# Patient Record
Sex: Male | Born: 1960 | Hispanic: No | Marital: Married | Smoking: Never smoker
Health system: Southern US, Community
[De-identification: ages and names within clinical notes are randomized; demographics above are authoritative.]

## PROBLEM LIST (undated history)

## (undated) DIAGNOSIS — T7840XA Allergy, unspecified, initial encounter: Secondary | ICD-10-CM

## (undated) DIAGNOSIS — K219 Gastro-esophageal reflux disease without esophagitis: Secondary | ICD-10-CM

## (undated) HISTORY — PX: VASECTOMY: SHX75

## (undated) HISTORY — PX: CHOLECYSTECTOMY: SHX55

## (undated) HISTORY — PX: WISDOM TOOTH EXTRACTION: SHX21

## (undated) HISTORY — DX: Allergy, unspecified, initial encounter: T78.40XA

## (undated) HISTORY — DX: Gastro-esophageal reflux disease without esophagitis: K21.9

---

## 1998-05-29 ENCOUNTER — Emergency Department (HOSPITAL_COMMUNITY): Admission: EM | Admit: 1998-05-29 | Discharge: 1998-05-29 | Payer: Self-pay | Admitting: Emergency Medicine

## 1998-06-10 ENCOUNTER — Ambulatory Visit (HOSPITAL_COMMUNITY): Admission: RE | Admit: 1998-06-10 | Discharge: 1998-06-10 | Payer: Self-pay | Admitting: Emergency Medicine

## 1998-07-26 ENCOUNTER — Ambulatory Visit (HOSPITAL_COMMUNITY): Admission: RE | Admit: 1998-07-26 | Discharge: 1998-07-26 | Payer: Self-pay | Admitting: General Surgery

## 1999-02-11 ENCOUNTER — Emergency Department (HOSPITAL_COMMUNITY): Admission: EM | Admit: 1999-02-11 | Discharge: 1999-02-11 | Payer: Self-pay | Admitting: Emergency Medicine

## 1999-02-16 ENCOUNTER — Emergency Department (HOSPITAL_COMMUNITY): Admission: EM | Admit: 1999-02-16 | Discharge: 1999-02-16 | Payer: Self-pay | Admitting: Emergency Medicine

## 2012-10-16 IMAGING — CR DG HIP COMPLETE 2+V*R*
2 series · 2 of 2 positions shown · non-contrast
Comparison: None.

CLINICAL DATA: Right hip pain

EXAM:
RIGHT HIP - COMPLETE 2+ VIEW

[AP]
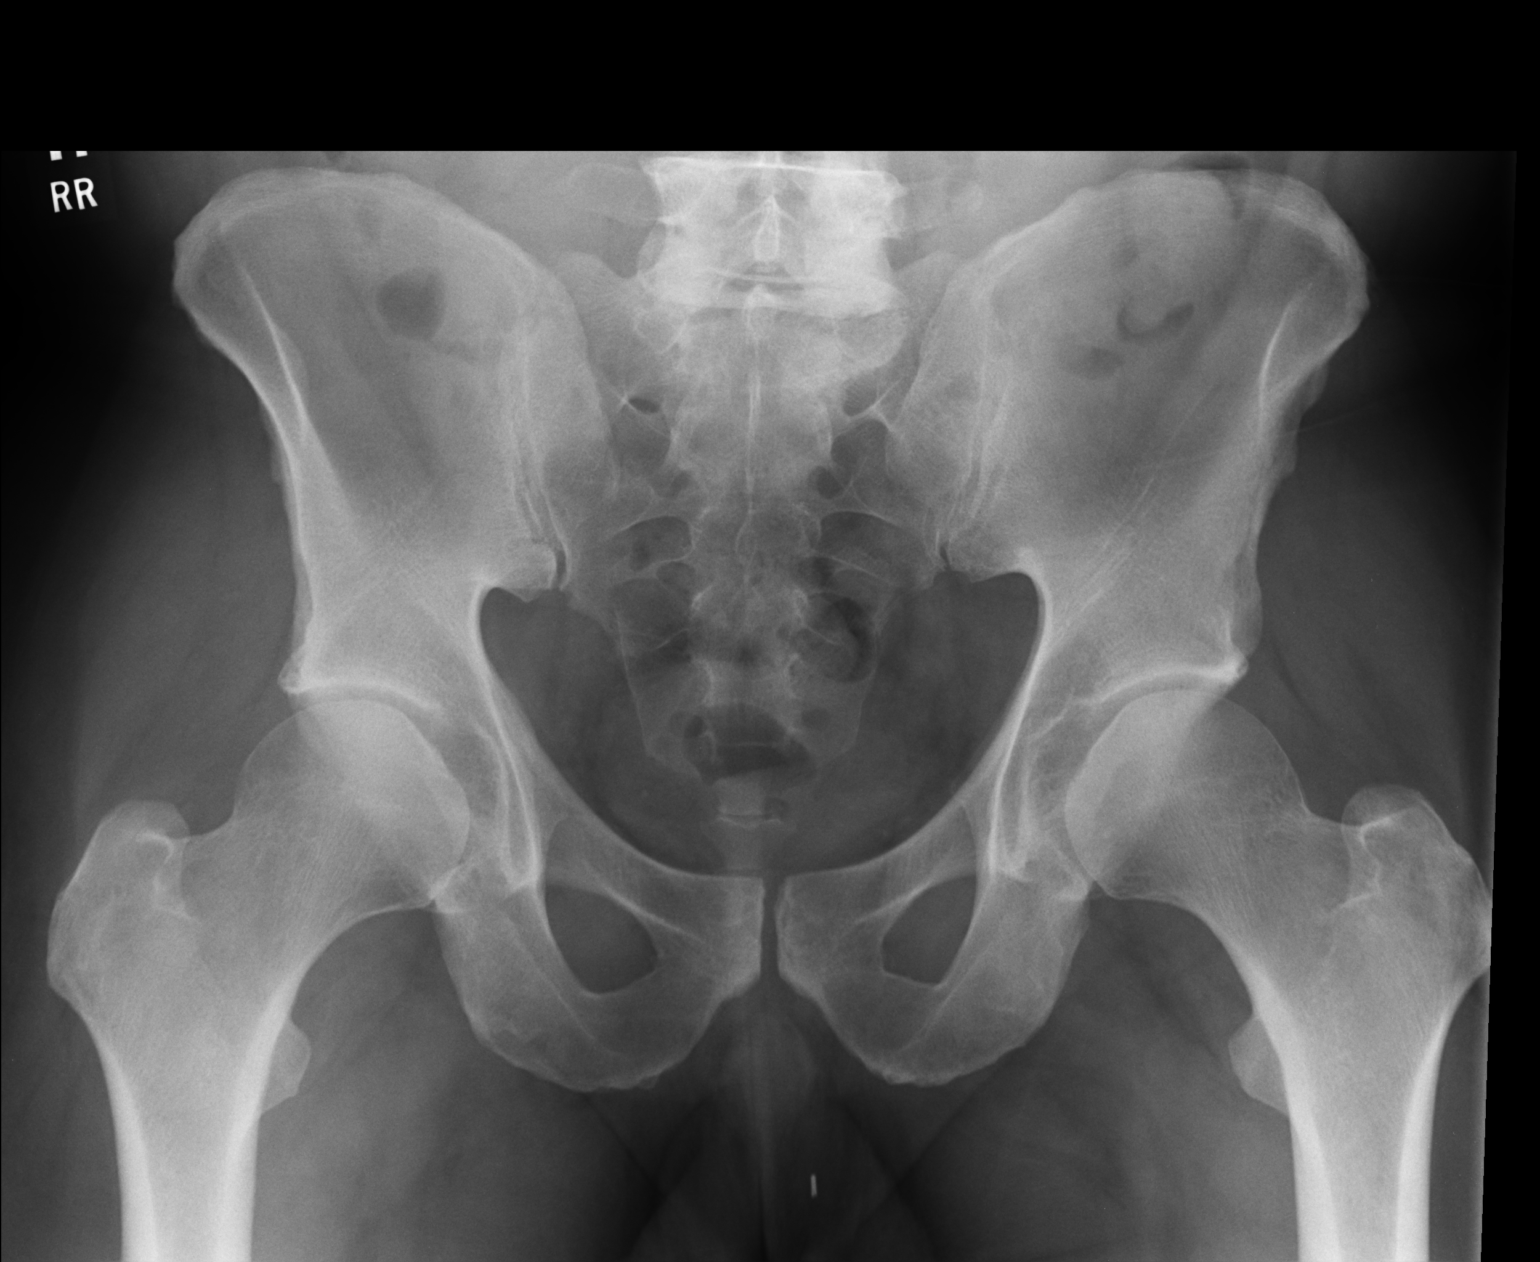

[lateral]
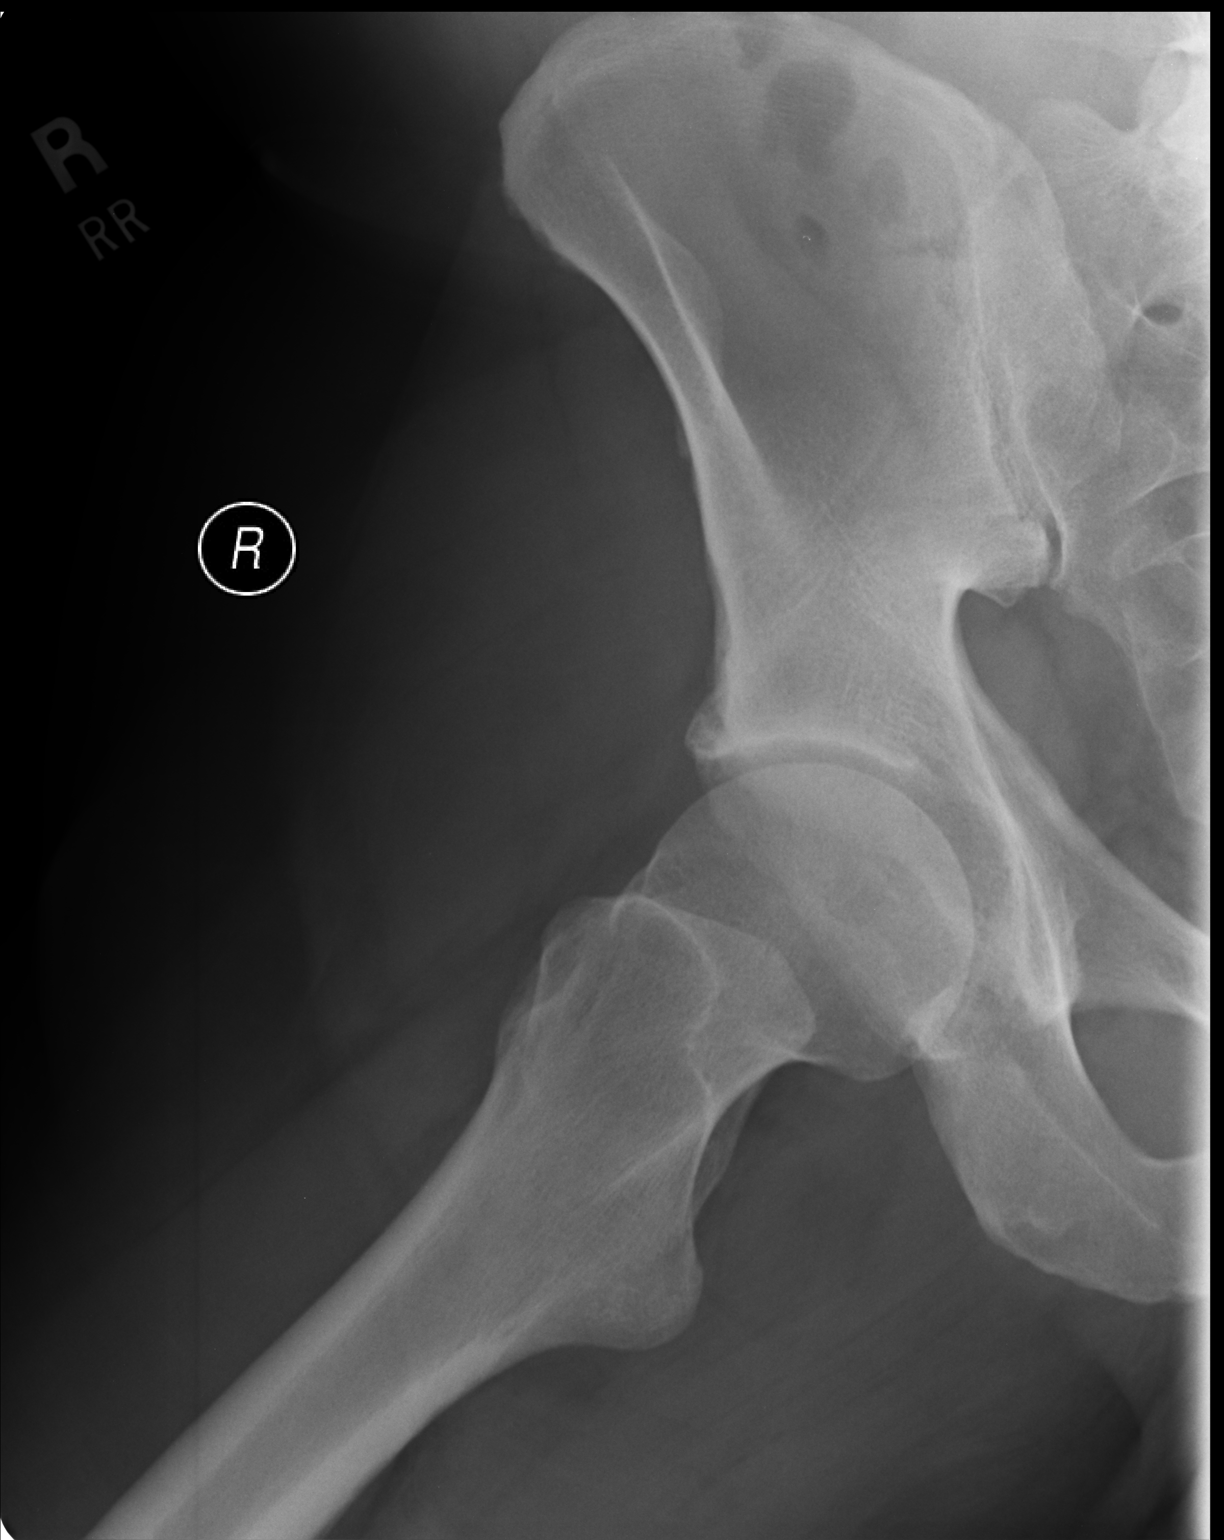

[2 of 2 positions shown; findings below may reference images not displayed]

FINDINGS: There is no evidence of hip fracture or dislocation. There is no
evidence of arthropathy or other focal bone abnormality.
IMPRESSION: Negative.

## 2013-10-29 HISTORY — PX: COLONOSCOPY: SHX174

## 2014-02-18 ENCOUNTER — Ambulatory Visit: Payer: BC Managed Care – PPO | Admitting: Emergency Medicine

## 2014-02-18 ENCOUNTER — Ambulatory Visit: Payer: BC Managed Care – PPO

## 2014-02-18 VITALS — BP 126/76 | HR 78 | Temp 97.9°F | Resp 16 | Ht 65.0 in | Wt 205.2 lb

## 2014-02-18 DIAGNOSIS — M25551 Pain in right hip: Secondary | ICD-10-CM

## 2014-02-18 DIAGNOSIS — M25559 Pain in unspecified hip: Secondary | ICD-10-CM

## 2014-02-18 DIAGNOSIS — M549 Dorsalgia, unspecified: Secondary | ICD-10-CM

## 2014-02-18 IMAGING — CR DG LUMBAR SPINE 2-3V
2 series · 2 of 2 positions shown · non-contrast
Comparison: None.

CLINICAL DATA: Right hip pain

EXAM:
LUMBAR SPINE - 2-3 VIEW

[AP]
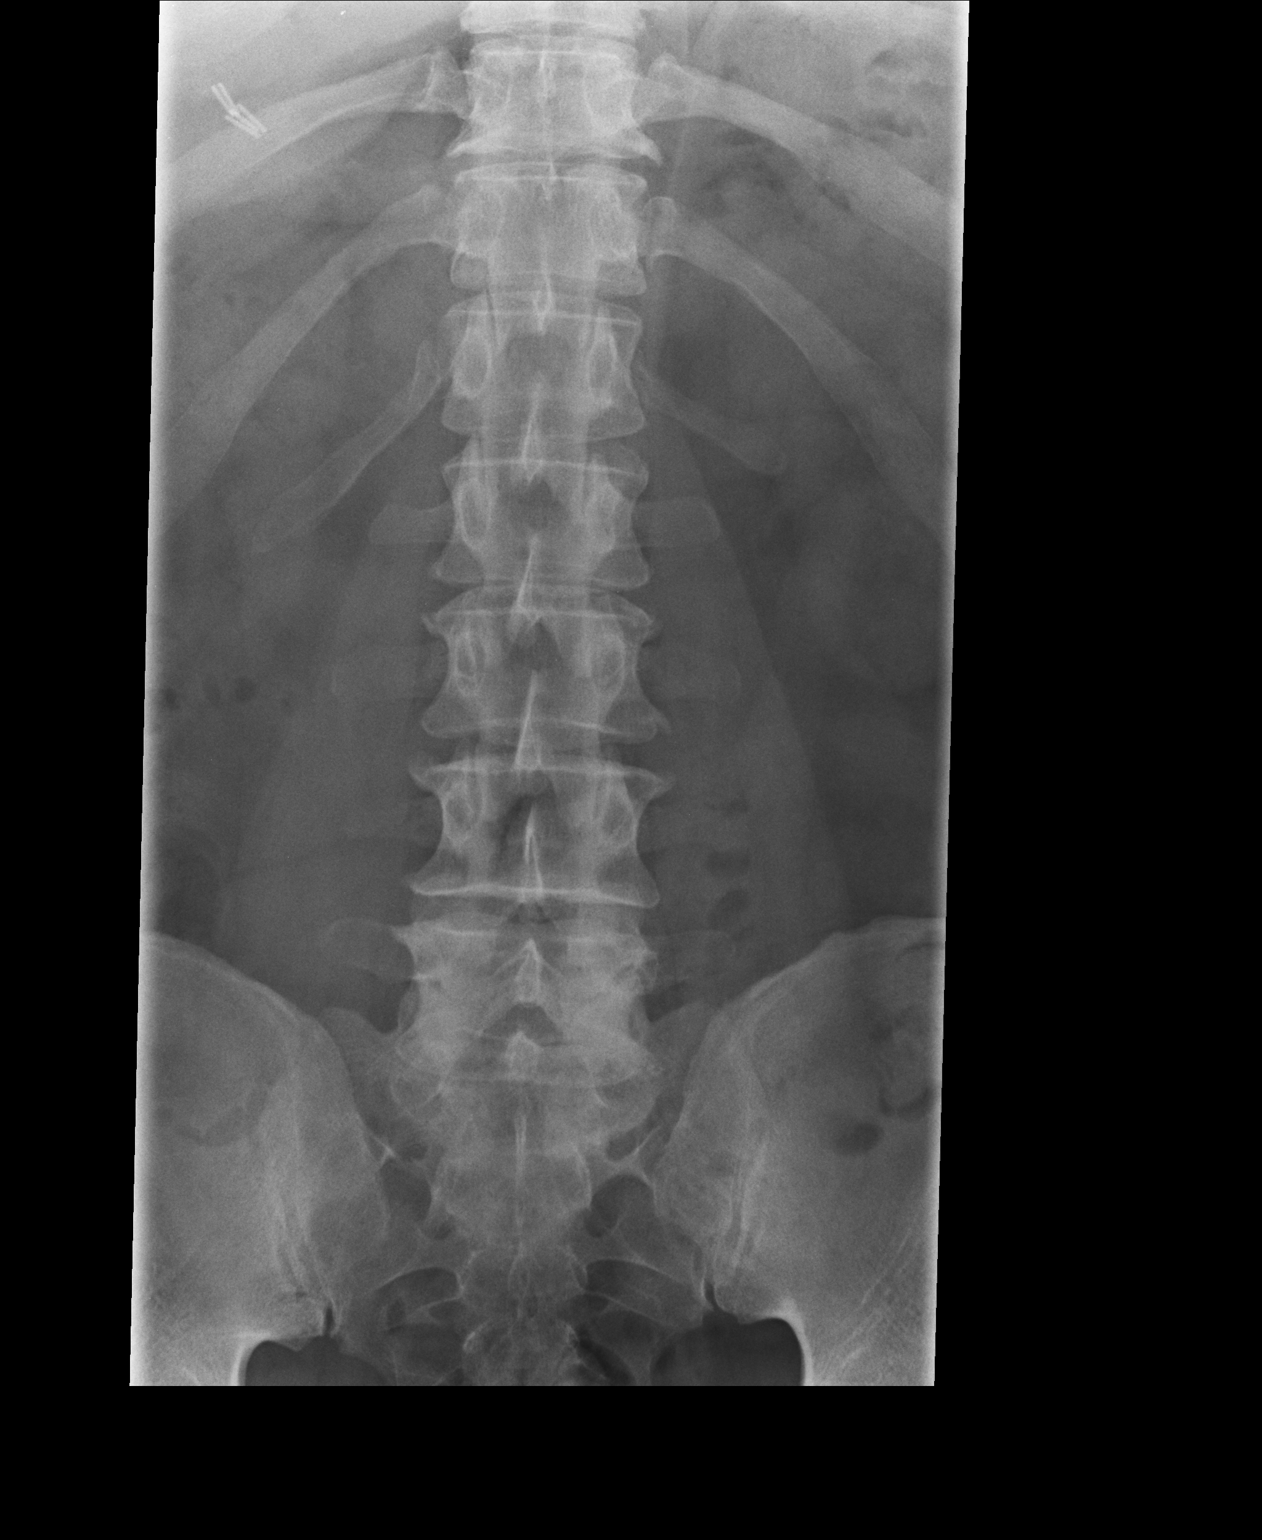

[lateral]
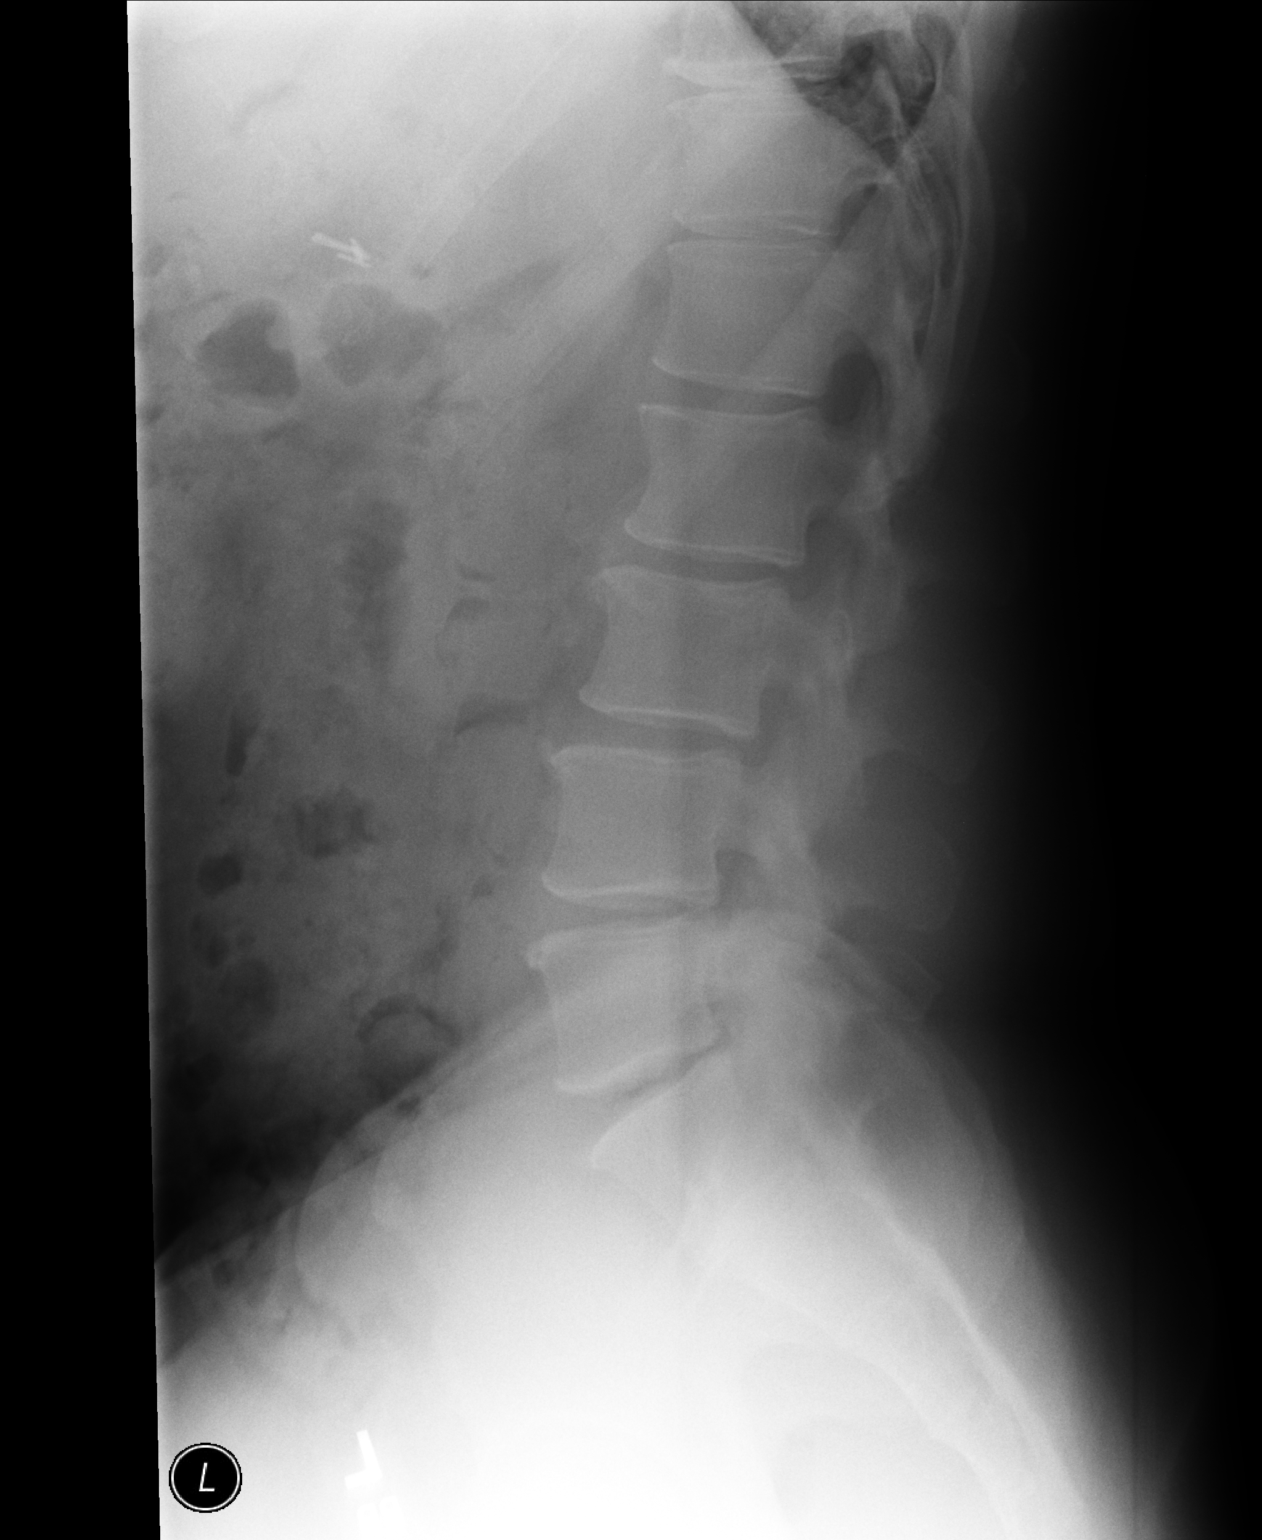

[2 of 2 positions shown; findings below may reference images not displayed]

FINDINGS: There are 5 nonrib bearing lumbar-type vertebral bodies. The
vertebral body heights are maintained. There is no spondylolysis.
There is no acute fracture. The disc spaces are maintained. Mild
degenerative disc disease and degenerative facet arthropathy at L4-5
and L5-S1. Mild retrolisthesis of L4 on L5 measuring 3 mm.

The SI joints are unremarkable.
IMPRESSION: Mild degenerative disc disease and degenerative facet arthropathy at
L4-5 and L5-S1.

## 2014-02-18 MED ORDER — TRAMADOL HCL 50 MG PO TABS: 50.0000 mg | ORAL_TABLET | Freq: Three times a day (TID) | ORAL | Status: DC | PRN

## 2014-02-18 MED ORDER — PREDNISONE 20 MG PO TABS: ORAL_TABLET | ORAL | Status: DC

## 2014-02-18 NOTE — Patient Instructions (Signed)
Sciatica °Sciatica is pain, weakness, numbness, or tingling along the path of the sciatic nerve. The nerve starts in the lower back and runs down the back of each leg. The nerve controls the muscles in the lower leg and in the back of the knee, while also providing sensation to the back of the thigh, lower leg, and the sole of your foot. Sciatica is a symptom of another medical condition. For instance, nerve damage or certain conditions, such as a herniated disk or bone spur on the spine, pinch or put pressure on the sciatic nerve. This causes the pain, weakness, or other sensations normally associated with sciatica. Generally, sciatica only affects one side of the body. °CAUSES  °· Herniated or slipped disc. °· Degenerative disk disease. °· A pain disorder involving the narrow muscle in the buttocks (piriformis syndrome). °· Pelvic injury or fracture. °· Pregnancy. °· Tumor (rare). °SYMPTOMS  °Symptoms can vary from mild to very severe. The symptoms usually travel from the low back to the buttocks and down the back of the leg. Symptoms can include: °· Mild tingling or dull aches in the lower back, leg, or hip. °· Numbness in the back of the calf or sole of the foot. °· Burning sensations in the lower back, leg, or hip. °· Sharp pains in the lower back, leg, or hip. °· Leg weakness. °· Severe back pain inhibiting movement. °These symptoms may get worse with coughing, sneezing, laughing, or prolonged sitting or standing. Also, being overweight may worsen symptoms. °DIAGNOSIS  °Your caregiver will perform a physical exam to look for common symptoms of sciatica. He or she may ask you to do certain movements or activities that would trigger sciatic nerve pain. Other tests may be performed to find the cause of the sciatica. These may include: °· Blood tests. °· X-rays. °· Imaging tests, such as an MRI or CT scan. °TREATMENT  °Treatment is directed at the cause of the sciatic pain. Sometimes, treatment is not necessary  and the pain and discomfort goes away on its own. If treatment is needed, your caregiver may suggest: °· Over-the-counter medicines to relieve pain. °· Prescription medicines, such as anti-inflammatory medicine, muscle relaxants, or narcotics. °· Applying heat or ice to the painful area. °· Steroid injections to lessen pain, irritation, and inflammation around the nerve. °· Reducing activity during periods of pain. °· Exercising and stretching to strengthen your abdomen and improve flexibility of your spine. Your caregiver may suggest losing weight if the extra weight makes the back pain worse. °· Physical therapy. °· Surgery to eliminate what is pressing or pinching the nerve, such as a bone spur or part of a herniated disk. °HOME CARE INSTRUCTIONS  °· Only take over-the-counter or prescription medicines for pain or discomfort as directed by your caregiver. °· Apply ice to the affected area for 20 minutes, 3 4 times a day for the first 48 72 hours. Then try heat in the same way. °· Exercise, stretch, or perform your usual activities if these do not aggravate your pain. °· Attend physical therapy sessions as directed by your caregiver. °· Keep all follow-up appointments as directed by your caregiver. °· Do not wear high heels or shoes that do not provide proper support. °· Check your mattress to see if it is too soft. A firm mattress may lessen your pain and discomfort. °SEEK IMMEDIATE MEDICAL CARE IF:  °· You lose control of your bowel or bladder (incontinence). °· You have increasing weakness in the lower back,   pelvis, buttocks, or legs. °· You have redness or swelling of your back. °· You have a burning sensation when you urinate. °· You have pain that gets worse when you lie down or awakens you at night. °· Your pain is worse than you have experienced in the past. °· Your pain is lasting longer than 4 weeks. °· You are suddenly losing weight without reason. °MAKE SURE YOU: °· Understand these  instructions. °· Will watch your condition. °· Will get help right away if you are not doing well or get worse. °Document Released: 10/09/2001 Document Revised: 04/15/2012 Document Reviewed: 02/24/2012 °[employer]® Patient Information ©2014 [employer]. ° °

## 2014-02-18 NOTE — Progress Notes (Signed)
   Subjective:    Patient ID: Bryan Hunt, male    DOB: Feb 09, 1961, 53 y.o.   MRN: [id number] This chart was scribed for Bryan Queen, MD by Rolanda Lundborg, ED Scribe. This patient was seen in room 1 and the patient's care was started at 8:04 AM.  Chief Complaint  Patient presents with  . Hip Pain    noticed monday , believes it to be from yoga he did on sunday     HPI HPI Comments: Bryan Hunt is a 53 y.o. male who presents to [facility] complaining of shooting right hip pain onset 3 days ago after doing yoga 4 days ago. He states the pain worsens when he goes from sitting to standing or vice versa. He does not remember any specific injuries during the yoga workout. He states he has not done yoga in over 6 months and was hoping to get loosened up for a golf trip that starts tomorrow.   PCP - Wendie Agreste, MD  History reviewed. No pertinent past medical history. No current outpatient prescriptions on file prior to visit.   No current facility-administered medications on file prior to visit.   Allergies  Allergen Reactions  . Dust Mite Extract      Review of Systems  Musculoskeletal: Positive for arthralgias (right hip).       Objective:   Physical Exam  CONSTITUTIONAL: Well developed/well nourished HEAD: Normocephalic/atraumatic EYES: EOMI/PERRL ENMT: Mucous membranes moist NECK: supple no meningeal signs SPINE: Tender L5 S1 on the right. Straight leg raise positive at 85 degrees. Pain when he tries to lay down. Reflexes are 2+ and symmetrical. Motor strength is symmetrical.  CV: S1/S2 noted, no murmurs/rubs/gallops noted LUNGS: Lungs are clear to auscultation bilaterally, no apparent distress ABDOMEN: soft, nontender, no rebound or guarding GU:no cva tenderness NEURO: Pt is awake/alert, moves all extremitiesx4 EXTREMITIES: pulses normal, full ROM.  SKIN: warm, color normal PSYCH: no abnormalities of mood noted   Filed Vitals:   02/18/14  0758  BP: 126/76  Pulse: 78  Temp: 97.9 F (36.6 C)  Resp: 16   [facility] reading (PRIMARY) by  Dr.Viviann Broyles hip film is normal. There is significant L5-S1 degenerative disease.      Assessment & Plan:  **Disclaimer: This note was dictated with voice recognition software. Similar sounding words can inadvertently be transcribed and this note may contain transcription errors which may not have been corrected upon publication of note.** Will treat with a prednisone taper Ultram for pain. I asked him to hold off on his yoga for now.

## 2014-03-20 ENCOUNTER — Ambulatory Visit: Payer: BC Managed Care – PPO | Admitting: Internal Medicine

## 2014-03-20 VITALS — BP 110/74 | HR 64 | Temp 97.8°F | Ht 65.0 in | Wt 205.0 lb

## 2014-03-20 DIAGNOSIS — IMO0002 Reserved for concepts with insufficient information to code with codable children: Secondary | ICD-10-CM

## 2014-03-20 MED ORDER — CEPHALEXIN 500 MG PO CAPS: 500.0000 mg | ORAL_CAPSULE | Freq: Three times a day (TID) | ORAL | Status: DC

## 2014-03-20 NOTE — Progress Notes (Signed)
   Subjective:    Patient ID: Bryan Hunt, male    DOB: 07/21/1961, 53 y.o.   MRN: [id number]  HPI This chart was scribed for Tami Lin, MD by Ladene Artist, ED Scribe. The patient was seen in room 11. Patient's care was started at 11:33 AM.  HPI Comments: Bryan Hunt is a 53 y.o. male who presents to the [facility] complaining of R great toe pain onset less than a week ago. Pt states that he had a pedicure approximately 3 weeks ago and suspects that they cut his toenail too low. No h/o similar symptoms. No known allergies.  No past medical history on file. Current Outpatient Prescriptions on File Prior to Visit  Medication Sig Dispense Refill  . predniSONE (DELTASONE) 20 MG tablet Take 3 a day for 3 days 2 a day for 3 days one a day for 3 days.  18 tablet  0  . traMADol (ULTRAM) 50 MG tablet Take 1 tablet (50 mg total) by mouth every 8 (eight) hours as needed.  30 tablet  0   No current facility-administered medications on file prior to visit.   Allergies  Allergen Reactions  . Dust Mite Extract     Review of Systems  Constitutional: Negative for fever and chills.  HENT: Negative for sore throat.   Skin: Positive for wound.      Objective:   Physical Exam  Nursing note and vitals reviewed. Constitutional: He is oriented to person, place, and time. He appears well-developed and well-nourished. No distress.  HENT:  Head: Normocephalic and atraumatic.  Eyes: EOM are normal.  Neck: Neck supple.  Pulmonary/Chest: Effort normal. No respiratory distress.  Musculoskeletal: Normal range of motion.  Neurological: He is alert and oriented to person, place, and time.  Skin: Skin is warm and dry.  R foot:  Redness and tenderness along the medial aspect of first nail Pus has drained from the proximal corner of the nail and there is an opening there   Psychiatric: He has a normal mood and affect. His behavior is normal.      Assessment & Plan:   I  personally performed the services described in this documentation, which was scribed in my presence. The recorded information has been reviewed and is accurate.  Paronychia  Meds ordered this encounter  Medications  . cephALEXin (KEFLEX) 500 MG capsule    Sig: Take 1 capsule (500 mg total) by mouth 3 (three) times daily.    Dispense:  30 capsule    Refill:  0   Hot soaks 3 times a day

## 2014-04-12 ENCOUNTER — Ambulatory Visit (INDEPENDENT_AMBULATORY_CARE_PROVIDER_SITE_OTHER): Payer: BC Managed Care – PPO | Admitting: Family Medicine

## 2014-04-12 ENCOUNTER — Encounter: Payer: Self-pay | Admitting: Family Medicine

## 2014-04-12 VITALS — BP 124/80 | HR 67 | Temp 98.0°F | Resp 16 | Ht 65.0 in | Wt 203.2 lb

## 2014-04-12 DIAGNOSIS — Z125 Encounter for screening for malignant neoplasm of prostate: Secondary | ICD-10-CM

## 2014-04-12 DIAGNOSIS — K219 Gastro-esophageal reflux disease without esophagitis: Secondary | ICD-10-CM

## 2014-04-12 DIAGNOSIS — Z1211 Encounter for screening for malignant neoplasm of colon: Secondary | ICD-10-CM

## 2014-04-12 DIAGNOSIS — Z0000 Encounter for general adult medical examination without abnormal findings: Secondary | ICD-10-CM

## 2014-04-12 LAB — POCT URINALYSIS DIPSTICK
Bilirubin, UA: NEGATIVE
Blood, UA: NEGATIVE
Glucose, UA: NEGATIVE
KETONES UA: NEGATIVE
LEUKOCYTES UA: NEGATIVE
Nitrite, UA: NEGATIVE
Protein, UA: NEGATIVE
Spec Grav, UA: 1.025
Urobilinogen, UA: 0.2
pH, UA: 6

## 2014-04-12 LAB — LIPID PANEL
CHOLESTEROL: 198 mg/dL (ref 0–200)
HDL: 43 mg/dL (ref >39)
LDL Cholesterol: 128 mg/dL — ABNORMAL HIGH (ref 0–99)
Total CHOL/HDL Ratio: 4.6 Ratio
Triglycerides: 134 mg/dL (ref <150)
VLDL: 27 mg/dL (ref 0–40)

## 2014-04-12 LAB — CBC
HCT: 43 % (ref 39.0–52.0)
HEMOGLOBIN: 15.4 g/dL (ref 13.0–17.0)
MCH: 31.3 pg (ref 26.0–34.0)
MCHC: 35.8 g/dL (ref 30.0–36.0)
MCV: 87.4 fL (ref 78.0–100.0)
PLATELETS: 196 10*3/uL (ref 150–400)
RBC: 4.92 MIL/uL (ref 4.22–5.81)
RDW: 13.6 % (ref 11.5–15.5)
WBC: 6.9 10*3/uL (ref 4.0–10.5)

## 2014-04-12 LAB — COMPLETE METABOLIC PANEL WITH GFR
ALBUMIN: 4.4 g/dL (ref 3.5–5.2)
ALK PHOS: 65 U/L (ref 39–117)
ALT: 25 U/L (ref 0–53)
AST: 21 U/L (ref 0–37)
BUN: 21 mg/dL (ref 6–23)
CO2: 26 mEq/L (ref 19–32)
Calcium: 9.4 mg/dL (ref 8.4–10.5)
Chloride: 105 mEq/L (ref 96–112)
Creat: 1.01 mg/dL (ref 0.50–1.35)
GFR, Est African American: >89 mL/min
GFR, Est Non African American: 85 mL/min
GLUCOSE: 89 mg/dL (ref 70–99)
POTASSIUM: 4.4 meq/L (ref 3.5–5.3)
SODIUM: 139 meq/L (ref 135–145)
TOTAL PROTEIN: 7 g/dL (ref 6.0–8.3)
Total Bilirubin: 0.6 mg/dL (ref 0.2–1.2)

## 2014-04-12 NOTE — Progress Notes (Signed)
   Subjective:    Patient ID: Bryan Hunt, male    DOB: 1960/11/19, 53 y.o.   MRN: [id number]  HPI    Review of Systems  Constitutional: Negative.   HENT: Negative.   Eyes: Negative.   Respiratory: Negative.   Cardiovascular: Negative.   Gastrointestinal: Negative.   Endocrine: Negative.   Genitourinary: Negative.   Musculoskeletal: Negative.   Skin: Negative.   Allergic/Immunologic: Positive for environmental allergies.  Neurological: Negative.   Hematological: Negative.   Psychiatric/Behavioral: Negative.        Objective:   Physical Exam        Assessment & Plan:

## 2014-04-12 NOTE — Progress Notes (Signed)
Subjective:    Patient ID: Bryan Hunt, male    DOB: 12/09/1960, 53 y.o.   MRN: [id number]  HPI Bryan Hunt is a 53 y.o. male Last CPE few years ago.   Colonoscopy: has not had. Agrees to have referral.   Prostate cancer screening/last PSA: not done. No known FH of prostate CA. Discussed screening. Agrees to screening today.   Tetanus/Tdap: 7 years ago - approx 2008.  Dentist: q 53months.  Optho/eye care eval: last eval past year.  Advanced Directives: has living will. Full Code.  Exercise: occasional walking or golfing. About 2hrs. Total. No fh of early CAD/heart disease.   Indigestion few weeks ago after traveling to [place], still some heartburn sx's.  Improved with otc prilosec.  Notes with coffee, and beer. Does eat spicy foods at times. No chest pain, no dyspnea, no N/V.  No unexplained wt loss, no dark/tarry stools.   Seasonal allergies - claritin otc. Controlled with this and flonase at times. No fevers.   There are no active problems to display for this patient.  Past Medical History  Diagnosis Date  . Allergy    Past Surgical History  Procedure Laterality Date  . Cholecystectomy    . Vasectomy     Allergies  Allergen Reactions  . Dust Mite Extract    Prior to Admission medications   Medication Sig Start Date End Date Taking? Authorizing Provider  loratadine (CLARITIN) 10 MG tablet Take 10 mg by mouth daily.   Yes Historical Provider, MD  PRESCRIPTION MEDICATION OTC Omeprazole taking   Yes Historical Provider, MD  cephALEXin (KEFLEX) 500 MG capsule Take 1 capsule (500 mg total) by mouth 3 (three) times daily. 03/20/14   Leandrew Koyanagi, MD   History   Social History  . Marital Status: Married    Spouse Name: N/A    Number of Children: N/A  . Years of Education: N/A   Occupational History  . Not on file.   Social History Main Topics  . Smoking status: Never Smoker   . Smokeless tobacco: Not on file  . Alcohol Use: Yes     Comment: 5-10 drinks    . Drug Use: No  . Sexual Activity: Yes   Other Topics Concern  . Not on file   Social History Narrative   Married. Education: [employer].      Review of Systems  13 point review of systems per patient health survey noted.  Negative other than as indicated on reviewed nursing note or above.       Objective:   Physical Exam  Vitals reviewed. Constitutional: He is oriented to person, place, and time. He appears well-developed and well-nourished.  HENT:  Head: Normocephalic and atraumatic.  Right Ear: External ear normal.  Left Ear: External ear normal.  Mouth/Throat: Oropharynx is clear and moist.  Eyes: Conjunctivae and EOM are normal. Pupils are equal, round, and reactive to light.  Neck: Normal range of motion. Neck supple. No thyromegaly present.  Cardiovascular: Normal rate, regular rhythm, normal heart sounds and intact distal pulses.   Pulmonary/Chest: Effort normal and breath sounds normal. No respiratory distress. He has no wheezes.  Abdominal: Soft. He exhibits no distension. There is no tenderness. Hernia confirmed negative in the right inguinal area and confirmed negative in the left inguinal area.  Genitourinary: Prostate normal.  Musculoskeletal: Normal range of motion. He exhibits no edema and no tenderness.  Lymphadenopathy:    He has no cervical adenopathy.  Neurological: He is alert  and oriented to person, place, and time. He has normal reflexes.  Skin: Skin is warm and dry.  Psychiatric: He has a normal mood and affect. His behavior is normal.   Filed Vitals:   04/12/14 1308  BP: 124/80  Pulse: 67  Temp: 98 F (36.7 C)  TempSrc: Oral  Resp: 16  Height: 5\' 5"  (1.651 m)  Weight: 203 lb 3.2 oz (92.171 kg)  SpO2: 95%   Results for orders placed in visit on 04/12/14  POCT URINALYSIS DIPSTICK      Result Value Ref Range   Color, UA yellow     Clarity, UA clear     Glucose, UA neg     Bilirubin, UA neg     Ketones, UA neg     Spec Grav, UA 1.025      Blood, UA neg     pH, UA 6.0     Protein, UA neg     Urobilinogen, UA 0.2     Nitrite, UA neg     Leukocytes, UA Negative         Assessment & Plan:   Bryan Hunt is a 53 y.o. male Annual physical exam - Plan: CBC, COMPLETE METABOLIC PANEL WITH GFR, Lipid panel, PSA, Ambulatory referral to Gastroenterology, POCT urinalysis dipstick  -anticipatory guidance as below in AVS, screening labs above. Health maintenance items as ove in HPI discussed/recommended as applicable.   Special screening for malignant neoplasms, colon - Plan: Ambulatory referral to Gastroenterology for screening colonoscopy.   Screening for prostate cancer - Plan: PSA - and DRE exam performed after discussion of risks/nbenefits of testing and consent obtained to test.   GERD (gastroesophageal reflux disease) - -trigger avoidance discussed, ok to cont prilosec otc for now. If persistent sx's greater than 4-6 wks, consider H pylori testing.    Meds ordered this encounter  Medications  . loratadine (CLARITIN) 10 MG tablet    Sig: Take 10 mg by mouth daily.  [name] PRESCRIPTION MEDICATION    Sig: OTC Omeprazole taking   Patient Instructions  You should receive a call or letter about your lab results within the next week to 10 days.  We will refer you for the colonoscopy.   Ok to continue prilosec and avoid inciting foods as below. If not improved in 4 weeks - return to check other causes.   Return to the clinic or go to the nearest emergency room if any of your symptoms worsen or new symptoms occur.  Keeping you healthy  Get these tests  Blood pressure- Have your blood pressure checked once a year by your healthcare provider.  Normal blood pressure is 120/80  Weight- Have your body mass index (BMI) calculated to screen for obesity.  BMI is a measure of body fat based on height and weight. You can also calculate your own BMI at [employer].  Cholesterol- Have your cholesterol checked every  year.  Diabetes- Have your blood sugar checked regularly if you have high blood pressure, high cholesterol, have a family history of diabetes or if you are overweight.  Screening for Colon Cancer- Colonoscopy starting at age 19.  Screening may begin sooner depending on your family history and other health conditions. Follow up colonoscopy as directed by your Gastroenterologist.  Screening for Prostate Cancer- Both blood work (PSA) and a rectal exam help screen for Prostate Cancer.  Screening begins at age 30 with African-American men and at age 89 with Caucasian men.  Screening may begin sooner depending on  your family history.  Take these medicines  Aspirin- One aspirin daily can help prevent Heart disease and Stroke.  Flu shot- Every fall.  Tetanus- Every 10 years.  Zostavax- Once after the age of 60 to prevent Shingles.  Pneumonia shot- Once after the age of 17; if you are younger than 16, ask your healthcare provider if you need a Pneumonia shot.  Take these steps  Don't smoke- If you do smoke, talk to your doctor about quitting.  For tips on how to quit, go to www.smokefree.gov or call 1-800-QUIT-NOW.  Be physically active- Exercise 5 days a week for at least 30 minutes.  If you are not already physically active start slow and gradually work up to 30 minutes of moderate physical activity.  Examples of moderate activity include walking briskly, mowing the yard, dancing, swimming, bicycling, etc.  Eat a healthy diet- Eat a variety of healthy food such as fruits, vegetables, low fat milk, low fat cheese, yogurt, lean meant, poultry, fish, beans, tofu, etc. For more information go to www.thenutritionsource.org  Drink alcohol in moderation- Limit alcohol intake to less than two drinks a day. Never drink and drive.  Dentist- Brush and floss twice daily; visit your dentist twice a year.  Depression- Your emotional health is as important as your physical health. If you're feeling down,  or losing interest in things you would normally enjoy please talk to your healthcare provider.  Eye exam- Visit your eye doctor every year.  Safe sex- If you may be exposed to a sexually transmitted infection, use a condom.  Seat belts- Seat belts can save your life; always wear one.  Smoke/Carbon Monoxide detectors- These detectors need to be installed on the appropriate level of your home.  Replace batteries at least once a year.  Skin cancer- When out in the sun, cover up and use sunscreen 15 SPF or higher.  Violence- If anyone is threatening you, please tell your healthcare provider.  Living Will/ Health care power of attorney- Speak with your healthcare provider and family.   Diet for Gastroesophageal Reflux Disease, Adult Reflux (acid reflux) is when acid from your stomach flows up into the esophagus. When acid comes in contact with the esophagus, the acid causes irritation and soreness (inflammation) in the esophagus. When reflux happens often or so severely that it causes damage to the esophagus, it is called gastroesophageal reflux disease (GERD). Nutrition therapy can help ease the discomfort of GERD. FOODS OR DRINKS TO AVOID OR LIMIT  Smoking or chewing tobacco. Nicotine is one of the most potent stimulants to acid production in the gastrointestinal tract.  Caffeinated and decaffeinated coffee and black tea.  Regular or low-calorie carbonated beverages or energy drinks (caffeine-free carbonated beverages are allowed).   Strong spices, such as black pepper, white pepper, red pepper, cayenne, curry powder, and chili powder.  Peppermint or spearmint.  Chocolate.  High-fat foods, including meats and fried foods. Extra added fats including oils, butter, salad dressings, and nuts. Limit these to less than 8 tsp per day.  Fruits and vegetables if they are not tolerated, such as citrus fruits or tomatoes.  Alcohol.  Any food that seems to aggravate your condition. If you  have questions regarding your diet, call your caregiver or a registered dietitian. OTHER THINGS THAT MAY HELP GERD INCLUDE:   Eating your meals slowly, in a relaxed setting.  Eating 5 to 6 small meals per day instead of 3 large meals.  Eliminating food for a period of time  if it causes distress.  Not lying down until 3 hours after eating a meal.  Keeping the head of your bed raised 6 to 9 inches (15 to 23 cm) by using a foam wedge or blocks under the legs of the bed. Lying flat may make symptoms worse.  Being physically active. Weight loss may be helpful in reducing reflux in overweight or obese adults.  Wear loose fitting clothing EXAMPLE MEAL PLAN This meal plan is approximately 2,000 calories based on [employer] meal planning guidelines. Breakfast   cup cooked oatmeal.  1 cup strawberries.  1 cup low-fat milk.  1 oz almonds. Snack  1 cup cucumber slices.  6 oz yogurt (made from low-fat or fat-free milk). Lunch  2 slice whole-wheat bread.  2 oz sliced [place].  2 tsp mayonnaise.  1 cup blueberries.  1 cup snap peas. Snack  6 whole-wheat crackers.  1 oz string cheese. Dinner   cup brown rice.  1 cup mixed veggies.  1 tsp olive oil.  3 oz grilled fish. Document Released: 10/15/2005 Document Revised: 01/07/2012 Document Reviewed: 08/31/2011 [facility] Patient Information 2014 [place], [place].

## 2014-04-12 NOTE — Patient Instructions (Addendum)
You should receive a call or letter about your lab results within the next week to 10 days.  We will refer you for the colonoscopy.   Ok to continue prilosec and avoid inciting foods as below. If not improved in 4 weeks - return to check other causes.   Return to the clinic or go to the nearest emergency room if any of your symptoms worsen or new symptoms occur.  Keeping you healthy  Get these tests  Blood pressure- Have your blood pressure checked once a year by your healthcare provider.  Normal blood pressure is 120/80  Weight- Have your body mass index (BMI) calculated to screen for obesity.  BMI is a measure of body fat based on height and weight. You can also calculate your own BMI at [employer]ing[employer].  Cholesterol- Have your cholesterol checked every year.  Diabetes- Have your blood sugar checked regularly if you have high blood pressure, high cholesterol, have a family history of diabetes or if you are overweight.  Screening for Colon Cancer- Colonoscopy starting at age 31.  Screening may begin sooner depending on your family history and other health conditions. Follow up colonoscopy as directed by your Gastroenterologist.  Screening for Prostate Cancer- Both blood work (PSA) and a rectal exam help screen for Prostate Cancer.  Screening begins at age 46 with African-American men and at age 34 with Caucasian men.  Screening may begin sooner depending on your family history.  Take these medicines  Aspirin- One aspirin daily can help prevent Heart disease and Stroke.  Flu shot- Every fall.  Tetanus- Every 10 years.  Zostavax- Once after the age of 50 to prevent Shingles.  Pneumonia shot- Once after the age of 22; if you are younger than 21, ask your healthcare provider if you need a Pneumonia shot.  Take these steps  Don't smoke- If you do smoke, talk to your doctor about quitting.  For tips on how to quit, go to www.smokefree.gov or call 1-800-QUIT-NOW.  Be  physically active- Exercise 5 days a week for at least 30 minutes.  If you are not already physically active start slow and gradually work up to 30 minutes of moderate physical activity.  Examples of moderate activity include walking briskly, mowing the yard, dancing, swimming, bicycling, etc.  Eat a healthy diet- Eat a variety of healthy food such as fruits, vegetables, low fat milk, low fat cheese, yogurt, lean meant, poultry, fish, beans, tofu, etc. For more information go to www.thenutritionsource.org  Drink alcohol in moderation- Limit alcohol intake to less than two drinks a day. Never drink and drive.  Dentist- Brush and floss twice daily; visit your dentist twice a year.  Depression- Your emotional health is as important as your physical health. If you're feeling down, or losing interest in things you would normally enjoy please talk to your healthcare provider.  Eye exam- Visit your eye doctor every year.  Safe sex- If you may be exposed to a sexually transmitted infection, use a condom.  Seat belts- Seat belts can save your life; always wear one.  Smoke/Carbon Monoxide detectors- These detectors need to be installed on the appropriate level of your home.  Replace batteries at least once a year.  Skin cancer- When out in the sun, cover up and use sunscreen 15 SPF or higher.  Violence- If anyone is threatening you, please tell your healthcare provider.  Living Will/ Health care power of attorney- Speak with your healthcare provider and family.   Diet for Gastroesophageal  Reflux Disease, Adult Reflux (acid reflux) is when acid from your stomach flows up into the esophagus. When acid comes in contact with the esophagus, the acid causes irritation and soreness (inflammation) in the esophagus. When reflux happens often or so severely that it causes damage to the esophagus, it is called gastroesophageal reflux disease (GERD). Nutrition therapy can help ease the discomfort of GERD. FOODS  OR DRINKS TO AVOID OR LIMIT  Smoking or chewing tobacco. Nicotine is one of the most potent stimulants to acid production in the gastrointestinal tract.  Caffeinated and decaffeinated coffee and black tea.  Regular or low-calorie carbonated beverages or energy drinks (caffeine-free carbonated beverages are allowed).   Strong spices, such as black pepper, white pepper, red pepper, cayenne, curry powder, and chili powder.  Peppermint or spearmint.  Chocolate.  High-fat foods, including meats and fried foods. Extra added fats including oils, butter, salad dressings, and nuts. Limit these to less than 8 tsp per day.  Fruits and vegetables if they are not tolerated, such as citrus fruits or tomatoes.  Alcohol.  Any food that seems to aggravate your condition. If you have questions regarding your diet, call your caregiver or a registered dietitian. OTHER THINGS THAT MAY HELP GERD INCLUDE:   Eating your meals slowly, in a relaxed setting.  Eating 5 to 6 small meals per day instead of 3 large meals.  Eliminating food for a period of time if it causes distress.  Not lying down until 3 hours after eating a meal.  Keeping the head of your bed raised 6 to 9 inches (15 to 23 cm) by using a foam wedge or blocks under the legs of the bed. Lying flat may make symptoms worse.  Being physically active. Weight loss may be helpful in reducing reflux in overweight or obese adults.  Wear loose fitting clothing EXAMPLE MEAL PLAN This meal plan is approximately 2,000 calories based on [employer] meal planning guidelines. Breakfast   cup cooked oatmeal.  1 cup strawberries.  1 cup low-fat milk.  1 oz almonds. Snack  1 cup cucumber slices.  6 oz yogurt (made from low-fat or fat-free milk). Lunch  2 slice whole-wheat bread.  2 oz sliced [place].  2 tsp mayonnaise.  1 cup blueberries.  1 cup snap peas. Snack  6 whole-wheat crackers.  1 oz string cheese. Dinner    cup brown rice.  1 cup mixed veggies.  1 tsp olive oil.  3 oz grilled fish. Document Released: 10/15/2005 Document Revised: 01/07/2012 Document Reviewed: 08/31/2011 [facility] Patient Information 2014 [place], [place].

## 2014-04-13 ENCOUNTER — Encounter: Payer: Self-pay | Admitting: Internal Medicine

## 2014-04-13 LAB — PSA: PSA: 0.99 ng/mL (ref <=4.00)

## 2014-04-14 ENCOUNTER — Other Ambulatory Visit: Payer: Self-pay | Admitting: *Deleted

## 2014-04-19 ENCOUNTER — Ambulatory Visit (AMBULATORY_SURGERY_CENTER): Payer: BC Managed Care – PPO | Admitting: *Deleted

## 2014-04-19 ENCOUNTER — Encounter: Payer: Self-pay | Admitting: Internal Medicine

## 2014-04-19 VITALS — Ht 65.0 in | Wt 207.0 lb

## 2014-04-19 DIAGNOSIS — Z1211 Encounter for screening for malignant neoplasm of colon: Secondary | ICD-10-CM

## 2014-04-19 MED ORDER — MOVIPREP 100 G PO SOLR
ORAL | Status: DC
Start: 2014-04-19 — End: 2014-05-11

## 2014-04-19 NOTE — Progress Notes (Signed)
Patient denies any allergies to eggs or soy. Patient denies any problems with anesthesia/sedation. Patient denies any oxygen use at home and does not take any diet/weight loss medications. EMMI education assisgned to patient on colonoscopy, this was explained and instructions given to patient. 

## 2014-05-05 ENCOUNTER — Encounter: Payer: Self-pay | Admitting: Internal Medicine

## 2014-05-11 ENCOUNTER — Ambulatory Visit (AMBULATORY_SURGERY_CENTER): Payer: BC Managed Care – PPO | Admitting: Internal Medicine

## 2014-05-11 ENCOUNTER — Encounter: Payer: Self-pay | Admitting: Internal Medicine

## 2014-05-11 VITALS — BP 123/93 | HR 64 | Temp 97.7°F | Resp 30 | Ht 65.0 in | Wt 207.0 lb

## 2014-05-11 DIAGNOSIS — D126 Benign neoplasm of colon, unspecified: Secondary | ICD-10-CM

## 2014-05-11 DIAGNOSIS — Z1211 Encounter for screening for malignant neoplasm of colon: Secondary | ICD-10-CM

## 2014-05-11 MED ORDER — SODIUM CHLORIDE 0.9 % IV SOLN: 500.0000 mL | INTRAVENOUS | Status: DC

## 2014-05-11 NOTE — Progress Notes (Signed)
Called to room to assist during endoscopic procedure.  Patient ID and intended procedure confirmed with present staff. Received instructions for my participation in the procedure from the performing physician.  

## 2014-05-11 NOTE — Op Note (Signed)
[facility]  [address] [place], [id number]   COLONOSCOPY PROCEDURE REPORT  PATIENT: Bryan Hunt, Bryan Hunt  MR#: [id number] BIRTHDATE: August 29, 1961 , 65  yrs. old GENDER: Male ENDOSCOPIST: Jerene Bears, MD REFERRED JA:[id number] Greene, M.D. PROCEDURE DATE:  05/11/2014 PROCEDURE:   Colonoscopy with snare polypectomy and Colonoscopy with cold biopsy polypectomy First Screening Colonoscopy - Avg.  risk and is 50 yrs.  old or older Yes.  Prior Negative Screening - Now for repeat screening. N/A  History of Adenoma - Now for follow-up colonoscopy & has been > or = to 3 yrs.  N/A  Polyps Removed Today? Yes. ASA CLASS:   Class II INDICATIONS:average risk screening and first colonoscopy. MEDICATIONS: MAC sedation, administered by CRNA and propofol (Diprivan) 300mg  IV  DESCRIPTION OF PROCEDURE:   After the risks benefits and alternatives of the procedure were thoroughly explained, informed consent was obtained.  A digital rectal exam revealed skin tag with no rectal mass.   The LB [id number]  endoscope was introduced through the anus and advanced to the cecum, which was identified by both the appendix and ileocecal valve. No adverse events experienced.   The quality of the prep was good, using MoviPrep The instrument was then slowly withdrawn as the colon was fully examined.   COLON FINDINGS: Five sessile polyps ranging between 3-28mm in size were found at the cecum (1), in the ascending colon (2), descending colon (1), and sigmoid colon (1).  Polypectomy was performed using cold snare and with cold forceps.  All resections were complete and all polyp tissue was completely retrieved.   Mild diverticulosis was noted in the sigmoid colon.  Retroflexed views revealed prominent anal papilla and small internal hemorrhoids. The time to cecum=3 minutes 07 seconds.  Withdrawal time=16 minutes 40 seconds. The scope was withdrawn and the procedure completed.  COMPLICATIONS: There  were no complications.    ENDOSCOPIC IMPRESSION: 1.   Five sessile polyps ranging between 3-68mm in size were found at the cecum, in the ascending colon, descending colon, and sigmoid colon; Polypectomy was performed using cold snare and with cold forceps 2.   Mild diverticulosis was noted in the sigmoid colon  RECOMMENDATIONS: 1.  Avoid all NSAIDS for the next 2 weeks. 2.  Await pathology results 3.  High fiber diet 4.  If the polyps removed today are proven to be adenomatous (pre-cancerous) polyps, you will need a colonoscopy in 3 years. Otherwise you should continue to follow colorectal cancer screening guidelines for "routine risk" patients with a colonoscopy in 10 years.  You will receive a letter within 1-2 weeks with the results of your biopsy as well as final recommendations.  Please call my office if you have not received a letter after 3 weeks.   eSigned:  Jerene Bears, MD 05/11/2014 2:44 PM   cc: The Patient and Merri Ray, MD   PATIENT NAME:  Bryan Hunt, Bryan Hunt MR#: [id number]

## 2014-05-11 NOTE — Progress Notes (Signed)
A/ox3, pleased with MAC, report to RN 

## 2014-05-11 NOTE — Patient Instructions (Signed)
YOU HAD AN ENDOSCOPIC PROCEDURE TODAY AT THE [facility]: Refer to the procedure report that was given to you for any specific questions about what was found during the examination.  If the procedure report does not answer your questions, please call your gastroenterologist to clarify.  If you requested that your care partner not be given the details of your procedure findings, then the procedure report has been included in a sealed envelope for you to review at your convenience later.  YOU SHOULD EXPECT: Some feelings of bloating in the abdomen. Passage of more gas than usual.  Walking can help get rid of the air that was put into your GI tract during the procedure and reduce the bloating. If you had a lower endoscopy (such as a colonoscopy or flexible sigmoidoscopy) you may notice spotting of blood in your stool or on the toilet paper. If you underwent a bowel prep for your procedure, then you may not have a normal bowel movement for a few days.  DIET: Your first meal following the procedure should be a light meal and then it is ok to progress to your normal diet.  A half-sandwich or bowl of soup is an example of a good first meal.  Heavy or fried foods are harder to digest and may make you feel nauseous or bloated.  Likewise meals heavy in dairy and vegetables can cause extra gas to form and this can also increase the bloating.  Drink plenty of fluids but you should avoid alcoholic beverages for 24 hours.  ACTIVITY: Your care partner should take you home directly after the procedure.  You should plan to take it easy, moving slowly for the rest of the day.  You can resume normal activity the day after the procedure however you should NOT DRIVE or use heavy machinery for 24 hours (because of the sedation medicines used during the test).    SYMPTOMS TO REPORT IMMEDIATELY: A gastroenterologist can be reached at any hour.  During normal business hours, 8:30 AM to 5:00 PM Monday through Friday,  call [phone].  After hours and on weekends, please call the GI answering service at [phone] who will take a message and have the physician on call contact you.   Following lower endoscopy (colonoscopy or flexible sigmoidoscopy):  Excessive amounts of blood in the stool  Significant tenderness or worsening of abdominal pains  Swelling of the abdomen that is new, acute  Fever of 100F or higher  FOLLOW UP: If any biopsies were taken you will be contacted by phone or by letter within the next 1-3 weeks.  Call your gastroenterologist if you have not heard about the biopsies in 3 weeks.  Our staff will call the home number listed on your records the next business day following your procedure to check on you and address any questions or concerns that you may have at that time regarding the information given to you following your procedure. This is a courtesy call and so if there is no answer at the home number and we have not heard from you through the emergency physician on call, we will assume that you have returned to your regular daily activities without incident.  SIGNATURES/CONFIDENTIALITY: You and/or your care partner have signed paperwork which will be entered into your electronic medical record.  These signatures attest to the fact that that the information above on your After Visit Summary has been reviewed and is understood.  Full responsibility of the confidentiality of this   discharge information lies with you and/or your care-partner.  Recommendations Avoid all NSAID's for the next two weeks. Await pathology results High fiber diet. Next colonoscopy determined by pathology report, 3 or 10 years.

## 2014-05-12 ENCOUNTER — Telehealth: Payer: Self-pay | Admitting: *Deleted

## 2014-05-12 NOTE — Telephone Encounter (Signed)
  Follow up Call-  Call back number 05/11/2014  Post procedure Call Back phone  # [phone]  Permission to leave phone message Yes     Patient questions:  Do you have a fever, pain , or abdominal swelling? No. Pain Score  0 *  Have you tolerated food without any problems? Yes.    Have you been able to return to your normal activities? Yes.    Do you have any questions about your discharge instructions: Diet   No. Medications  No. Follow up visit  No.  Do you have questions or concerns about your Care? No.  Actions: * If pain score is 4 or above: No action needed, pain <4.

## 2014-05-17 ENCOUNTER — Encounter: Payer: Self-pay | Admitting: Internal Medicine

## 2015-03-05 ENCOUNTER — Ambulatory Visit (INDEPENDENT_AMBULATORY_CARE_PROVIDER_SITE_OTHER): Payer: BLUE CROSS/BLUE SHIELD | Admitting: Emergency Medicine

## 2015-03-05 ENCOUNTER — Ambulatory Visit (INDEPENDENT_AMBULATORY_CARE_PROVIDER_SITE_OTHER): Payer: BLUE CROSS/BLUE SHIELD

## 2015-03-05 VITALS — BP 120/76 | HR 74 | Temp 98.1°F | Resp 18 | Ht 66.0 in | Wt 213.0 lb

## 2015-03-05 DIAGNOSIS — M25571 Pain in right ankle and joints of right foot: Secondary | ICD-10-CM

## 2015-03-05 IMAGING — CR DG ANKLE COMPLETE 3+V*R*
2 series · 2 of 2 positions shown · non-contrast
Comparison: None.

CLINICAL DATA: Ankle injury 1 week ago.  Persistent pain.

EXAM:
RIGHT ANKLE - COMPLETE 3+ VIEW

[AP]
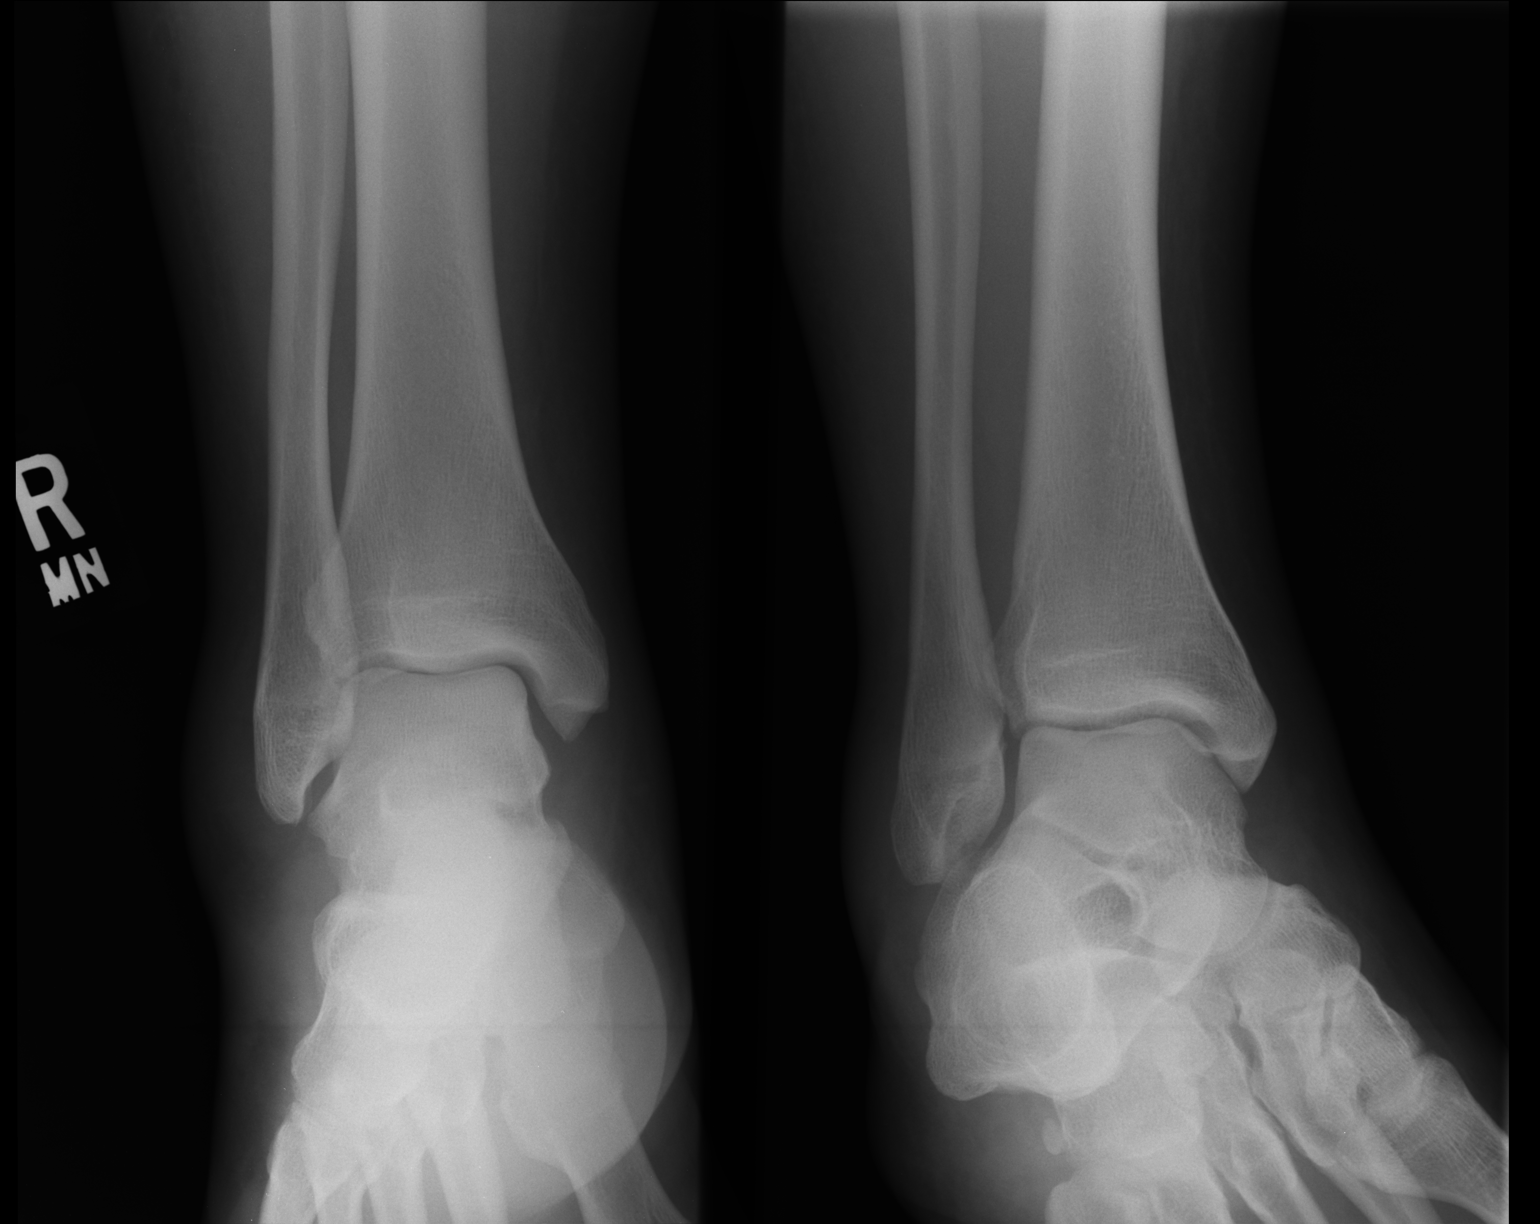

[ap obl int rot]
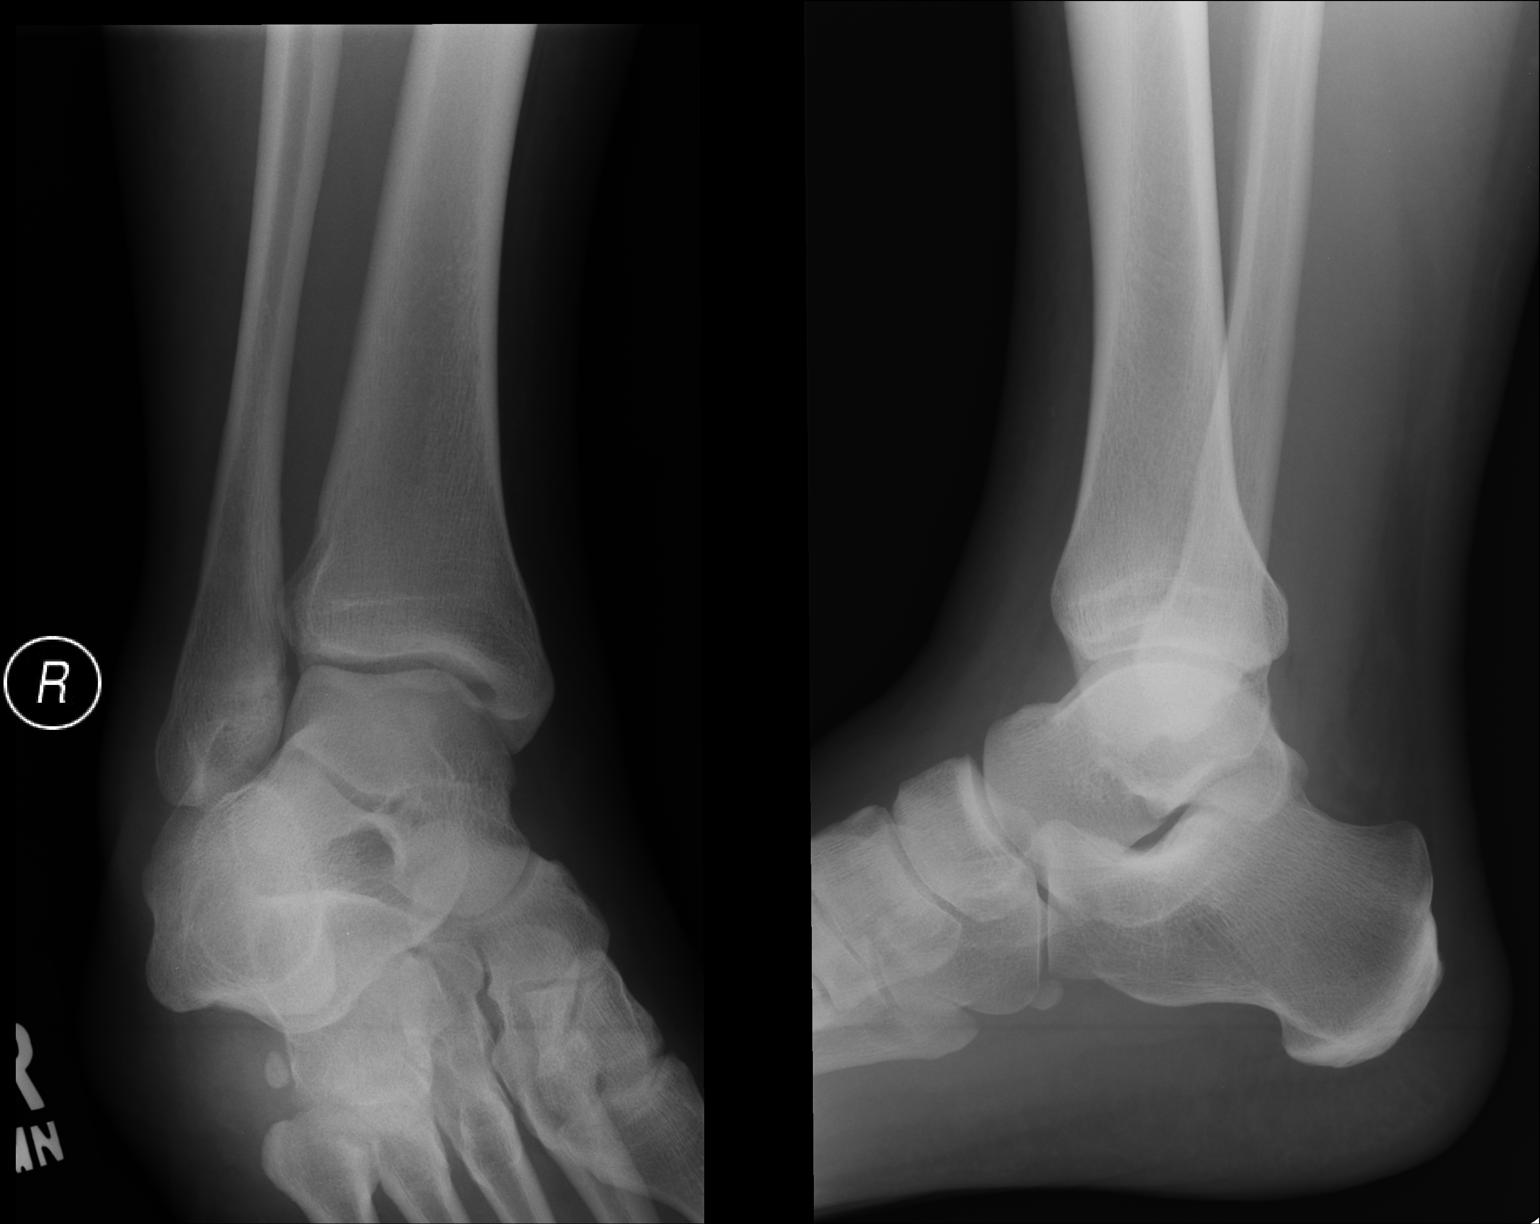

[2 of 2 positions shown; findings below may reference images not displayed]

FINDINGS: No fracture. Ankle mortise is normally space and aligned. There is
soft tissue swelling which predominates laterally.
IMPRESSION: No fracture or dislocation.

## 2015-03-05 NOTE — Progress Notes (Addendum)
   Subjective:  This chart was scribed for Bryan Jordan, MD by [facility], medical scribe at [facility].The patient was seen in exam room 04 and the patient's care was started at 12:10 PM.   Patient ID: Bryan Hunt, male    DOB: 23-Nov-1960, 54 y.o.   MRN: [id number] Chief Complaint  Patient presents with  . Ankle Injury    rt x1 week ago   . Ankle Pain   HPI  HPI Comments: Mayjor Ager is a 54 y.o. male who presents to [facility] complaining of right ankle pain, acute onset last week. Pt was stepping down while out and rolled his right ankle away from his body. He has iced extensively. Initially improved, but has worsened since going to a trade show.   Review of Systems  Musculoskeletal: Positive for joint swelling and arthralgias.      Objective:  BP 120/76 mmHg  Pulse 74  Temp(Src) 98.1 F (36.7 C) (Oral)  Resp 18  Ht 5\' 6"  (1.676 m)  Wt 213 lb (96.616 kg)  BMI 34.40 kg/m2  SpO2 93% Physical Exam  Nursing note and vitals reviewed.  CONSTITUTIONAL: Well developed/well nourished HEAD: Normocephalic/atraumatic EYES: EOMI/PERRL ENMT: Mucous membranes moist NECK: supple no meningeal signs SPINE/BACK:entire spine nontender CV: S1/S2 noted, no murmurs/rubs/gallops noted LUNGS: Lungs are clear to auscultation bilaterally, no apparent distress ABDOMEN: soft, nontender, no rebound or guarding, bowel sounds noted throughout abdomen GU:no cva tenderness NEURO: Pt is awake/alert/appropriate, moves all extremitiesx4.  No facial droop.   EXTREMITIES: pulses normal/equal, full ROM. Significant swelling medially and laterally, achilles is intact, no foot swelling. There is tenderness of the ankle medially and laterally.  SKIN: warm, color normal PSYCH: no abnormalities of mood noted, alert and oriented to situation   [facility] reading (PRIMARY) by  Dr. Everlene Farrier no fracture seen  Assessment & Plan:  No fracture seen. He is placed in a Aircast. He will  start range of motion exercises. Recheck in 7-10 days. If he continues to have discomfort he will need physical therapy.I personally performed the services described in this documentation, which was scribed in my presence. The recorded information has been reviewed and is accurate.  Bryan Jordan, MD

## 2015-03-05 NOTE — Progress Notes (Deleted)
   Subjective:    Patient ID: Bryan Hunt, male    DOB: Mar 02, 1961, 54 y.o.   MRN: [id number]  HPI    Review of Systems     Objective:   Physical Exam        Assessment & Plan:

## 2015-03-05 NOTE — Patient Instructions (Signed)
Return in 1 week Ankle Sprain An ankle sprain is an injury to the strong, fibrous tissues (ligaments) that hold the bones of your ankle joint together.  CAUSES An ankle sprain is usually caused by a fall or by twisting your ankle. Ankle sprains most commonly occur when you step on the outer edge of your foot, and your ankle turns inward. People who participate in sports are more prone to these types of injuries.  SYMPTOMS   Pain in your ankle. The pain may be present at rest or only when you are trying to stand or walk.  Swelling.  Bruising. Bruising may develop immediately or within 1 to 2 days after your injury.  Difficulty standing or walking, particularly when turning corners or changing directions. DIAGNOSIS  Your caregiver will ask you details about your injury and perform a physical exam of your ankle to determine if you have an ankle sprain. During the physical exam, your caregiver will press on and apply pressure to specific areas of your foot and ankle. Your caregiver will try to move your ankle in certain ways. An X-ray exam may be done to be sure a bone was not broken or a ligament did not separate from one of the bones in your ankle (avulsion fracture).  TREATMENT  Certain types of braces can help stabilize your ankle. Your caregiver can make a recommendation for this. Your caregiver may recommend the use of medicine for pain. If your sprain is severe, your caregiver may refer you to a surgeon who helps to restore function to parts of your skeletal system (orthopedist) or a physical therapist. Poston ice to your injury for 1-2 days or as directed by your caregiver. Applying ice helps to reduce inflammation and pain.  Put ice in a plastic bag.  Place a towel between your skin and the bag.  Leave the ice on for 15-20 minutes at a time, every 2 hours while you are awake.  Only take over-the-counter or prescription medicines for pain, discomfort, or  fever as directed by your caregiver.  Elevate your injured ankle above the level of your heart as much as possible for 2-3 days.  If your caregiver recommends crutches, use them as instructed. Gradually put weight on the affected ankle. Continue to use crutches or a cane until you can walk without feeling pain in your ankle.  If you have a plaster splint, wear the splint as directed by your caregiver. Do not rest it on anything harder than a pillow for the first 24 hours. Do not put weight on it. Do not get it wet. You may take it off to take a shower or bath.  You may have been given an elastic bandage to wear around your ankle to provide support. If the elastic bandage is too tight (you have numbness or tingling in your foot or your foot becomes cold and blue), adjust the bandage to make it comfortable.  If you have an air splint, you may blow more air into it or let air out to make it more comfortable. You may take your splint off at night and before taking a shower or bath. Wiggle your toes in the splint several times per day to decrease swelling. SEEK MEDICAL CARE IF:   You have rapidly increasing bruising or swelling.  Your toes feel extremely cold or you lose feeling in your foot.  Your pain is not relieved with medicine. SEEK IMMEDIATE MEDICAL CARE IF:  Your toes  are numb or blue.  You have severe pain that is increasing. MAKE SURE YOU:   Understand these instructions.  Will watch your condition.  Will get help right away if you are not doing well or get worse. Document Released: 10/15/2005 Document Revised: 07/09/2012 Document Reviewed: 10/27/2011 [facility] Patient Information 2015 [place], [place]. This information is not intended to replace advice given to you by your health care provider. Make sure you discuss any questions you have with your health care provider.

## 2015-03-13 ENCOUNTER — Ambulatory Visit (INDEPENDENT_AMBULATORY_CARE_PROVIDER_SITE_OTHER): Payer: BLUE CROSS/BLUE SHIELD | Admitting: Emergency Medicine

## 2015-03-13 VITALS — BP 125/82 | HR 75 | Temp 97.7°F | Ht 66.0 in | Wt 213.2 lb

## 2015-03-13 DIAGNOSIS — M25571 Pain in right ankle and joints of right foot: Secondary | ICD-10-CM

## 2015-03-13 DIAGNOSIS — S80811A Abrasion, right lower leg, initial encounter: Secondary | ICD-10-CM | POA: Diagnosis not present

## 2015-03-13 NOTE — Progress Notes (Signed)
   Subjective:  This chart was scribed for Bryan Queen, MD by Moises Blood, Medical Scribe. This patient was seen in Room 5 and the patient's care was started 2:44 PM.    Patient ID: Bryan Hunt, male    DOB: 1961-05-13, 54 y.o.   MRN: [id number]  HPI Bryan Hunt is a 54 y.o. male who presents to [facility] for a follow up on sudden onset right ankle pain that occurred 2 weeks ago. The brace has helped the patient walk around better and improved since last visit.     Review of Systems     Objective:   Physical Exam CONSTITUTIONAL: Well developed/Wel nourished HEAD: Normocephalic/atraumatic EYES: EOMI/PERRL ENMT: Mucous membranes moist NECK: supple no meningeal signs SPINE/BACK: entire spine nontender CV: S1/S2 noted, no murmurs/rubs/gallops noted LUNGS: Lungs are clear to auscultation bilaterally, no apparent distress ABDOMEN: soft, non tender, no rebound or guarding, bowel sounds noted throughout abdomen GU: no cva tenderness NEURO: Pt is awake/alert/appropriate, moves all extremities x4. No facial droop. EXTREMITIES: pulses normal/equal, full ROM; 2+ swelling medial and lateral; linear area with small ulceration lateral right lower leg SKIN: warm, color normal PSYCH: no abnormalities of mood noted, alert, and oriented to situation        Assessment & Plan:  He has an area of abrasion from the splint on the right. He is not to wear his splint as much. He is to work on range of motion. He was given a therapy band. Recheck if he continues to have problems.I personally performed the services described in this documentation, which was scribed in my presence. The recorded information has been reviewed and is accurate.  Nena Jordan, MD

## 2015-03-14 ENCOUNTER — Ambulatory Visit (INDEPENDENT_AMBULATORY_CARE_PROVIDER_SITE_OTHER): Payer: BLUE CROSS/BLUE SHIELD | Admitting: Emergency Medicine

## 2015-03-14 VITALS — BP 102/60 | HR 83 | Temp 97.9°F | Resp 16 | Ht 66.0 in | Wt 212.6 lb

## 2015-03-14 DIAGNOSIS — K5732 Diverticulitis of large intestine without perforation or abscess without bleeding: Secondary | ICD-10-CM

## 2015-03-14 DIAGNOSIS — R509 Fever, unspecified: Secondary | ICD-10-CM

## 2015-03-14 LAB — POCT CBC
Granulocyte percent: 77 %G (ref 37–80)
HEMATOCRIT: 42.9 % — AB (ref 43.5–53.7)
HEMOGLOBIN: 13.8 g/dL — AB (ref 14.1–18.1)
Lymph, poc: 2.3 (ref 0.6–3.4)
MCH, POC: 29.6 pg (ref 27–31.2)
MCHC: 32.1 g/dL (ref 31.8–35.4)
MCV: 92.2 fL (ref 80–97)
MID (cbc): 1 — AB (ref 0–0.9)
MPV: 7.3 fL (ref 0–99.8)
POC Granulocyte: 11.1 — AB (ref 2–6.9)
POC LYMPH PERCENT: 16.3 %L (ref 10–50)
POC MID %: 6.7 %M (ref 0–12)
Platelet Count, POC: 215 10*3/uL (ref 142–424)
RBC: 4.66 M/uL — AB (ref 4.69–6.13)
RDW, POC: 14 %
WBC: 14.4 10*3/uL — AB (ref 4.6–10.2)

## 2015-03-14 LAB — POCT URINALYSIS DIPSTICK
BILIRUBIN UA: NEGATIVE
Blood, UA: NEGATIVE
Glucose, UA: NEGATIVE
Ketones, UA: NEGATIVE
LEUKOCYTES UA: NEGATIVE
Nitrite, UA: NEGATIVE
PROTEIN UA: NEGATIVE
Urobilinogen, UA: 0.2
pH, UA: 6

## 2015-03-14 LAB — POCT UA - MICROSCOPIC ONLY
Casts, Ur, LPF, POC: NEGATIVE
Crystals, Ur, HPF, POC: NEGATIVE
Mucus, UA: NEGATIVE
RBC, urine, microscopic: NEGATIVE
YEAST UA: NEGATIVE

## 2015-03-14 MED ORDER — METRONIDAZOLE 500 MG PO TABS: 500.0000 mg | ORAL_TABLET | Freq: Four times a day (QID) | ORAL | Status: DC

## 2015-03-14 MED ORDER — CIPROFLOXACIN HCL 500 MG PO TABS: 500.0000 mg | ORAL_TABLET | Freq: Two times a day (BID) | ORAL | Status: DC

## 2015-03-14 NOTE — Progress Notes (Signed)
[facility] [address], [place] [address] [phone]  Date:  03/14/2015   Name:  Bryan Hunt   DOB:  19-Jul-1961   MRN:  [id number]  PCP:  Wendie Agreste, MD    Chief Complaint: Abdominal Pain; Back Pain; and Urinary Frequency   History of Present Illness:  Bryan Hunt is a 54 y.o. very pleasant male patient who presents with the following:  Last night developed a fever and chills The patient has no cough, wheezing or shortness of breath. No coryza No sore throat Patient denies nausea or vomiting No stool change No rash. Some lower abdominal pain No dysuria.  Has frequency and urgency No improvement with over the counter medications or other home remedies. Denies other complaint or health concern today.   There are no active problems to display for this patient.   Past Medical History  Diagnosis Date  . Allergy   . GERD (gastroesophageal reflux disease)     Past Surgical History  Procedure Laterality Date  . Cholecystectomy    . Vasectomy      History  Substance Use Topics  . Smoking status: Never Smoker   . Smokeless tobacco: Never Used  . Alcohol Use: 6.0 oz/week    10 Glasses of wine per week     Comment: 5-10 drinks beer/wine per week per patient    Family History  Problem Relation Age of Onset  . Lung cancer Father   . Colon cancer Neg Hx     Allergies  Allergen Reactions  . Dust Mite Extract     Medication list has been reviewed and updated.  Current Outpatient Prescriptions on File Prior to Visit  Medication Sig Dispense Refill  . loratadine (CLARITIN) 10 MG tablet Take 10 mg by mouth daily.    [name] PRESCRIPTION MEDICATION OTC Omeprazole taking     No current facility-administered medications on file prior to visit.    Review of Systems:  Review of Systems  Constitutional: Negative for fever, chills and fatigue.  HENT: Negative for congestion, ear pain, hearing loss, postnasal drip, rhinorrhea and sinus  pressure.   Eyes: Negative for discharge and redness.  Respiratory: Negative for cough, shortness of breath and wheezing.   Cardiovascular: Negative for chest pain and leg swelling.  Gastrointestinal: Negative for nausea, vomiting, abdominal pain, constipation and blood in stool.  Genitourinary: Negative for dysuria, urgency and frequency.  Musculoskeletal: Negative for neck stiffness.  Skin: Negative for rash.  Neurological: Negative for seizures, weakness and headaches.   Physical Examination: Filed Vitals:   03/14/15 1906  BP: 102/60  Pulse: 83  Temp: 97.9 F (36.6 C)  Resp: 16   Filed Vitals:   03/14/15 1906  Height: 5\' 6"  (1.676 m)  Weight: 212 lb 9.6 oz (96.435 kg)   Body mass index is 34.33 kg/(m^2). Ideal Body Weight: Weight in (lb) to have BMI = 25: 154.6  GEN: WDWN, NAD, Non-toxic, A & O x 3 HEENT: Atraumatic, Normocephalic. Neck supple. No masses, No LAD. Ears and Nose: No external deformity. CV: RRR, No M/G/R. No JVD. No thrill. No extra heart sounds. PULM: CTA B, no wheezes, crackles, rhonchi. No retractions. No resp. distress. No accessory muscle use. ABD: S, NT, ND, +BS. No rebound. No HSM. EXTR: No c/c/e NEURO Normal gait.  PSYCH: Normally interactive. Conversant. Not depressed or anxious appearing.  Calm demeanor.    Assessment and Plan: 1. Fever, unspecified fever cause  - POCT urinalysis dipstick - POCT UA -  Microscopic Only  2. Diverticulitis cipro Flagyl Appropriate red flags and actions were discussed with the patient who understands. Follow up with Dr Carlota Raspberry in two weeks  Signed Ellison Carwin, MD  Results for orders placed or performed in visit on 03/14/15  POCT urinalysis dipstick  Result Value Ref Range   Color, UA yellow    Clarity, UA clear    Glucose, UA neg    Bilirubin, UA neg    Ketones, UA neg    Spec Grav, UA <=1.005    Blood, UA neg    pH, UA 6.0    Protein, UA neg    Urobilinogen, UA 0.2    Nitrite, UA neg     Leukocytes, UA Negative   POCT UA - Microscopic Only  Result Value Ref Range   WBC, Ur, HPF, POC 0-1    RBC, urine, microscopic neg    Bacteria, U Microscopic trace    Mucus, UA neg    Epithelial cells, urine per micros 0-2    Crystals, Ur, HPF, POC neg    Casts, Ur, LPF, POC neg    Yeast, UA neg   POCT CBC  Result Value Ref Range   WBC 14.4 (A) 4.6 - 10.2 K/uL   Lymph, poc 2.3 0.6 - 3.4   POC LYMPH PERCENT 16.3 10 - 50 %L   MID (cbc) 1.0 (A) 0 - 0.9   POC MID % 6.7 0 - 12 %M   POC Granulocyte 11.1 (A) 2 - 6.9   Granulocyte percent 77.0 37 - 80 %G   RBC 4.66 (A) 4.69 - 6.13 M/uL   Hemoglobin 13.8 (A) 14.1 - 18.1 g/dL   HCT, POC 42.9 (A) 43.5 - 53.7 %   MCV 92.2 80 - 97 fL   MCH, POC 29.6 27 - 31.2 pg   MCHC 32.1 31.8 - 35.4 g/dL   RDW, POC 14.0 %   Platelet Count, POC 215 142 - 424 K/uL   MPV 7.3 0 - 99.8 fL

## 2015-03-14 NOTE — Patient Instructions (Signed)

## 2015-10-21 ENCOUNTER — Ambulatory Visit (INDEPENDENT_AMBULATORY_CARE_PROVIDER_SITE_OTHER): Payer: Worker's Compensation | Admitting: Family Medicine

## 2015-10-21 ENCOUNTER — Ambulatory Visit: Payer: Worker's Compensation

## 2015-10-21 VITALS — BP 110/72 | HR 71 | Temp 97.9°F | Resp 18 | Ht 66.0 in | Wt 209.4 lb

## 2015-10-21 DIAGNOSIS — M542 Cervicalgia: Secondary | ICD-10-CM | POA: Diagnosis not present

## 2015-10-21 DIAGNOSIS — S1093XA Contusion of unspecified part of neck, initial encounter: Secondary | ICD-10-CM

## 2015-10-21 IMAGING — CR DG CERVICAL SPINE 2 OR 3 VIEWS
5 series · 5 of 5 positions shown · non-contrast
Comparison: None.

CLINICAL DATA: Neck pain, trauma 2 posterior neck

EXAM:
CERVICAL SPINE - 2-3 VIEW

[lateral]
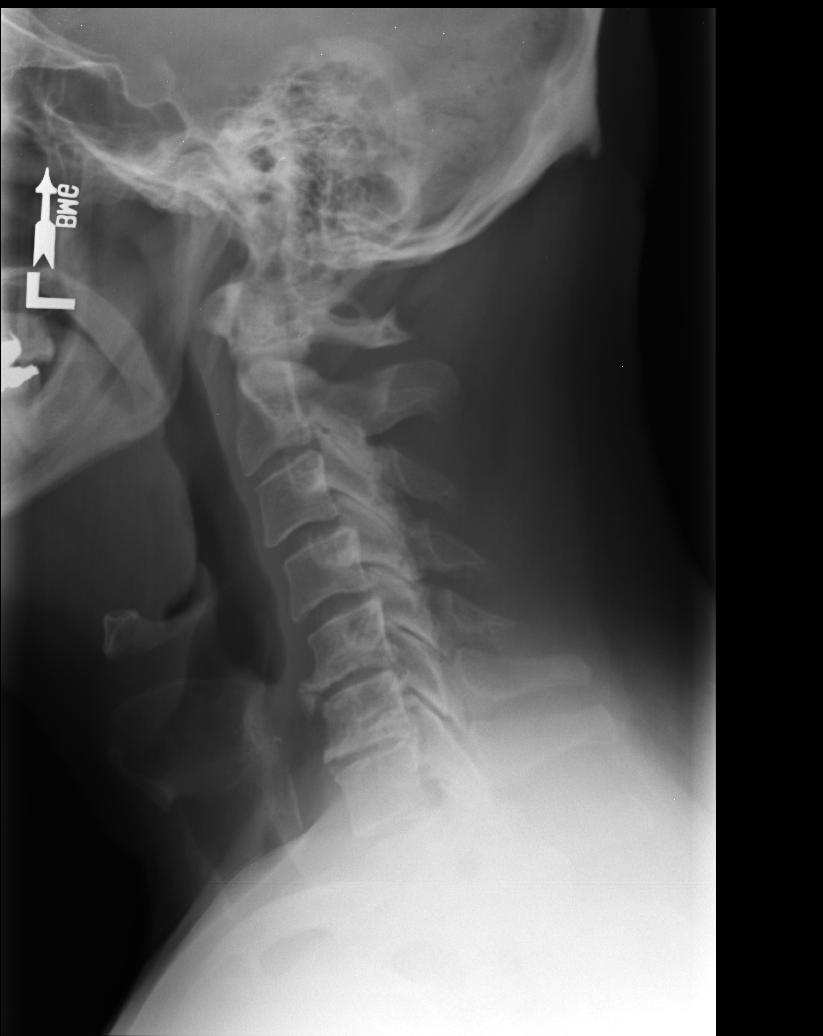

[ap open mouth]
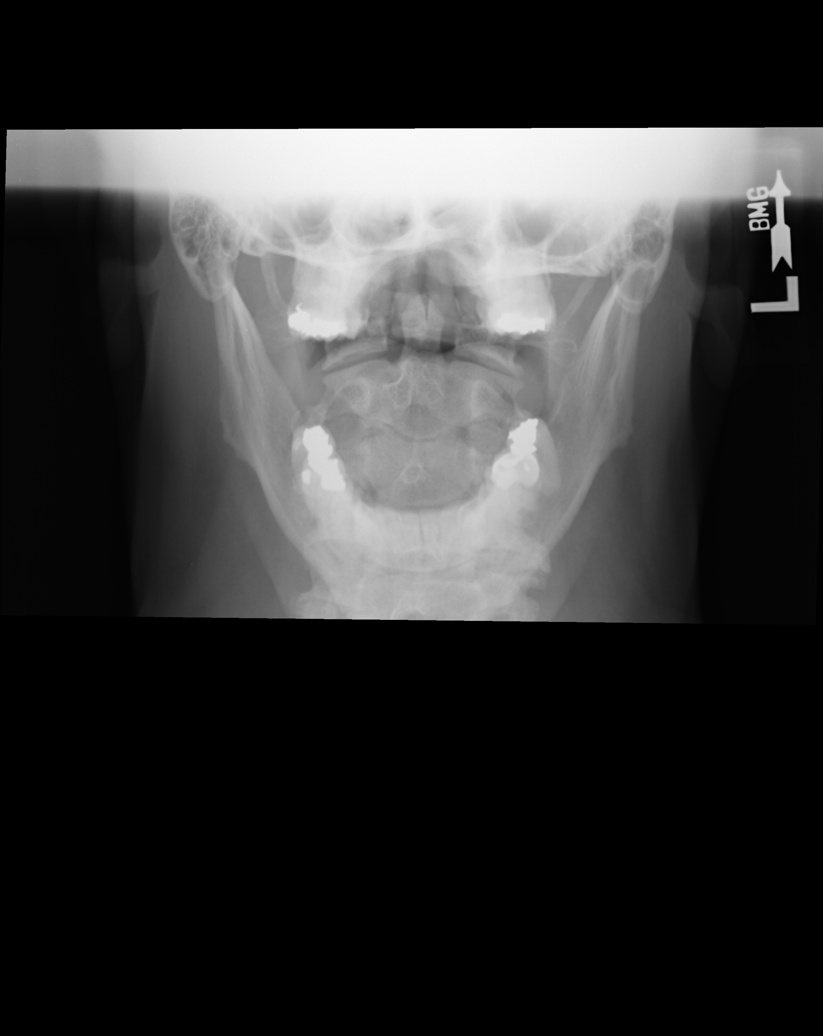

[AP (1 of 2)]
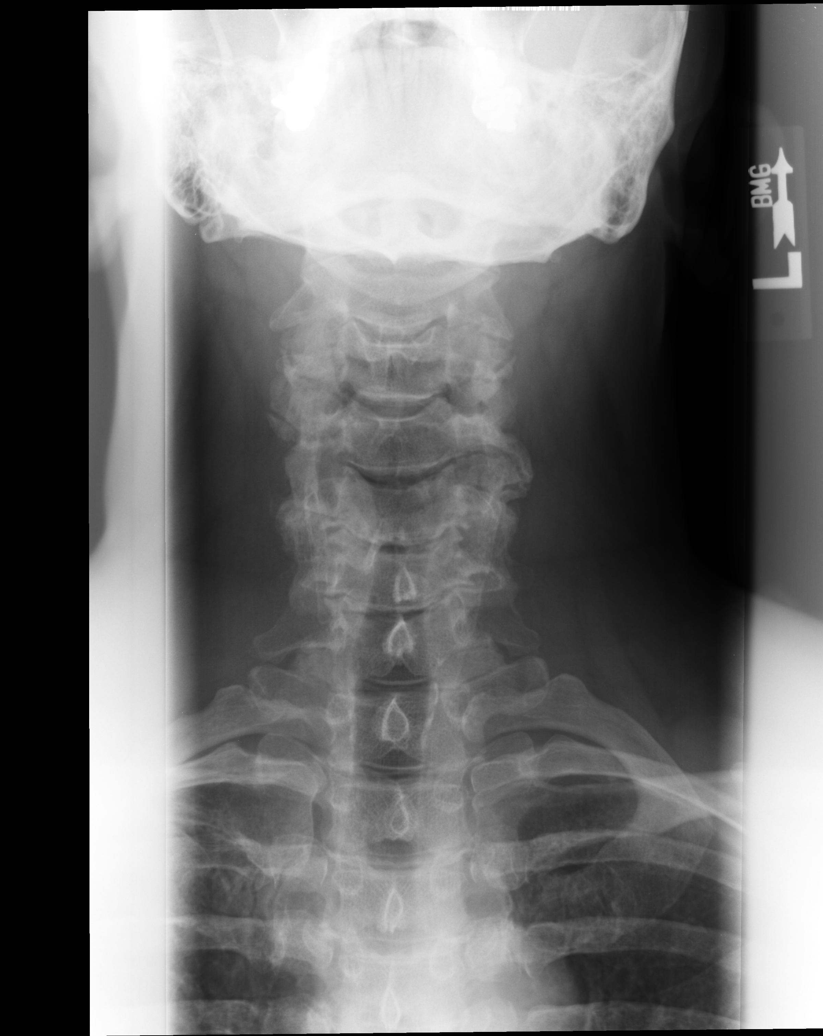

[swimmers]
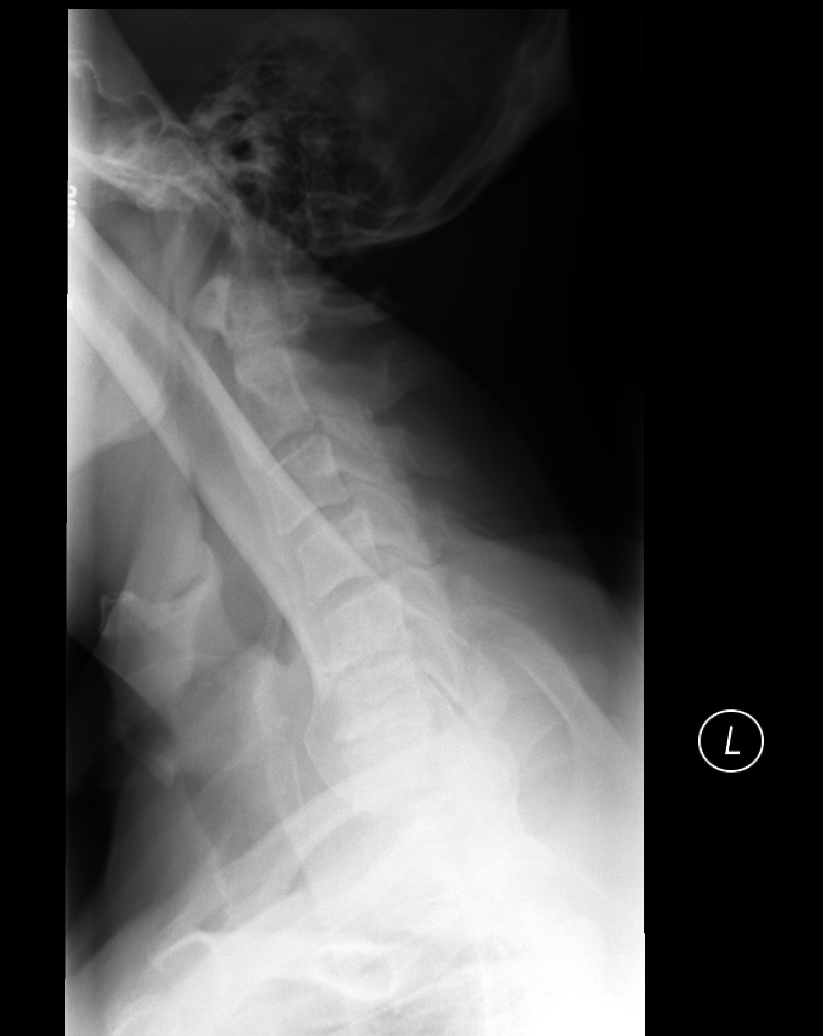

[AP (2 of 2)]
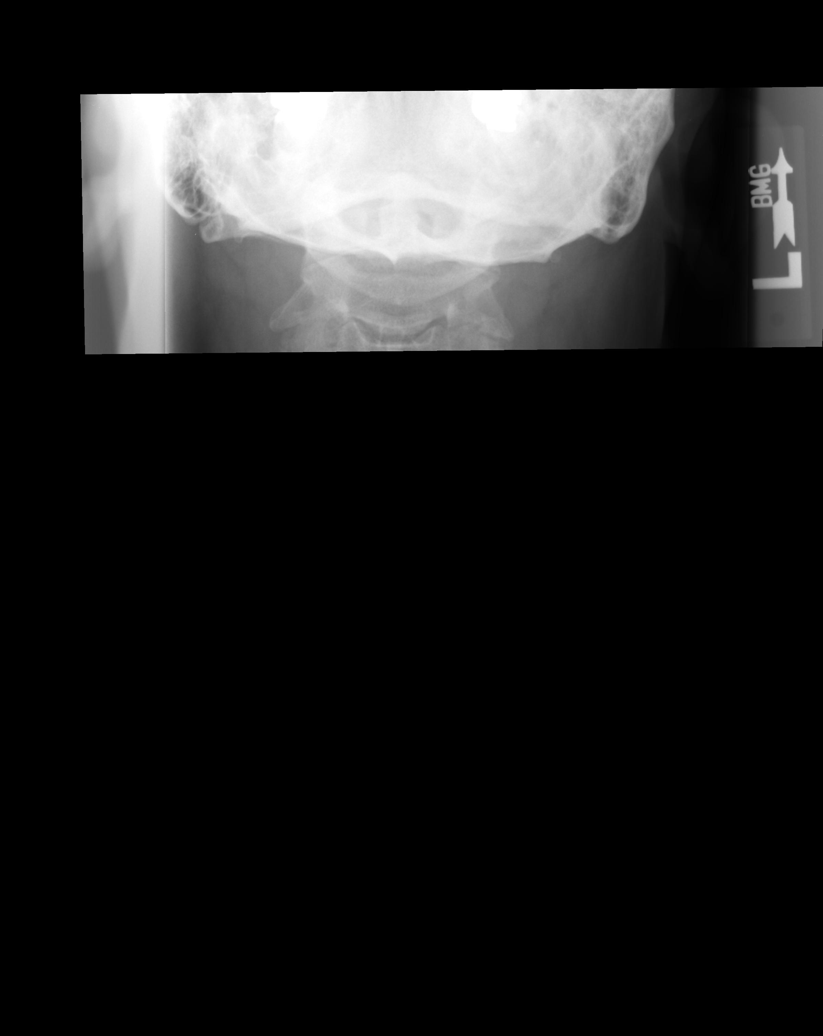

[5 of 5 positions shown; findings below may reference images not displayed]

FINDINGS: Five views of cervical spine submitted. No acute fracture. Mild disc
space flattening at C2-C3 level. There is mild anterolisthesis about
3 mm C3 on C4 vertebral body. Minimal anterolisthesis about 1.7 mm
C4 on C5 vertebral body. Moderate disc space flattening with
anterior spurring C5-C6 and C6-C7 level. No prevertebral soft tissue
swelling. Cervical airway is patent.
IMPRESSION: No acute fracture. Mild anterolisthesis about 3 mm C3 on C4
vertebral body. Minimal anterolisthesis about 1.7 mm C4 on C5
vertebral body. Disc space flattening at C2-C3, C5-C6 and C6-C7
level.

## 2015-10-21 MED ORDER — MELOXICAM 7.5 MG PO TABS: 7.5000 mg | ORAL_TABLET | Freq: Every day | ORAL | Status: DC

## 2015-10-21 MED ORDER — CYCLOBENZAPRINE HCL 5 MG PO TABS: 5.0000 mg | ORAL_TABLET | Freq: Every day | ORAL | Status: DC

## 2015-10-21 NOTE — Progress Notes (Signed)
By signing my name below, I, Moises Blood, attest that this documentation has been prepared under the direction and in the presence of Robyn Haber, MD. Electronically Signed: Moises Blood, [place]. 10/21/2015 , 11:22 AM .  Patient was seen in room 14 .   Patient ID: Bryan Hunt MRN: [id number], DOB: [birth year], 54 y.o. Date of Encounter: 10/21/2015  Primary Physician: Wendie Agreste, MD  Chief Complaint:  Chief Complaint  Patient presents with  . Head Injury    happened this morning at work, was hit in head. No visual or gait changes.     HPI:  Bryan Hunt is a 54 y.o. male who presents to [facility] complaining of head injury while at work this morning. 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 He was seeing products with someone else and the other person was sent to the hospital by EMS.   He works as Merchant [employer] officer.   Allergies:  Allergies  Allergen Reactions  . Dust Mite Extract      Review of Systems: Constitutional: negative for fever, chills, night sweats, weight changes, or fatigue  HEENT: negative for vision changes, hearing loss, congestion, rhinorrhea, ST, epistaxis, or sinus pressure Cardiovascular: negative for chest pain or palpitations Respiratory: negative for hemoptysis, wheezing, shortness of breath, or cough Abdominal: negative for abdominal pain, nausea, vomiting, diarrhea, or constipation Dermatological: negative for rash Neurologic: negative for headache, dizziness, or syncope Musc: positive for neck pain, neck stiffness  All other systems reviewed and are otherwise negative with the exception to those above and in the HPI.  Physical Exam: Blood pressure 110/72, pulse 71, temperature 97.9 F (36.6 C), temperature source Oral, resp. rate 18, height 5\' 6"  (1.676 m), weight 209 lb 6.4 oz (94.983 kg), SpO2 96 %., Body mass index is 33.81 kg/(m^2). General: Well  developed, well nourished, in no acute distress. Head: Normocephalic, atraumatic, eyes without discharge, sclera non-icteric, nares are without discharge. Bilateral auditory canals clear, TM's are without perforation, pearly grey and translucent with reflective cone of light bilaterally. Oral cavity moist, posterior pharynx without exudate, erythema, peritonsillar abscess, or post nasal drip.  Neck: Supple. No thyromegaly. Full ROM. No lymphadenopathy. Lungs: Clear bilaterally to auscultation without wheezes, rales, or rhonchi. Breathing is unlabored. Heart: RRR with S1 S2. No murmurs, rubs, or gallops appreciated. Msk:  Strength and tone normal for age. Non tender, good rom, no ecchymosis of back or neck, no swelling Extremities/Skin: Warm and dry. No clubbing or cyanosis. No edema. No rashes or suspicious lesions. Neuro: Alert and oriented X 3. Moves all extremities spontaneously. Gait is normal. CNII-XII grossly in tact. Psych:  Responds to questions appropriately with a normal affect.   [facility] reading (PRIMARY) by Dr. Joseph Art : c-spine: spondylosis, no soft tissue swelling, good alignment    ASSESSMENT AND PLAN:  54 y.o. year old male with neck contusion but no evidence of serious injury This chart was scribed in my presence and reviewed by me personally.    ICD-9-CM ICD-10-CM   1. Neck pain 723.1 M54.2 DG Cervical Spine 2 or 3 views     meloxicam (MOBIC) 7.5 MG tablet     cyclobenzaprine (FLEXERIL) 5 MG tablet  2. Neck contusion, initial encounter 920 S10.93XA meloxicam (MOBIC) 7.5 MG tablet     cyclobenzaprine (FLEXERIL) 5 MG tablet     Signed, Robyn Haber, MD     Signed, Robyn Haber, MD 10/21/2015 11:22  AM     

## 2015-11-08 ENCOUNTER — Ambulatory Visit (INDEPENDENT_AMBULATORY_CARE_PROVIDER_SITE_OTHER): Payer: BLUE CROSS/BLUE SHIELD | Admitting: Family Medicine

## 2015-11-08 VITALS — BP 132/88 | HR 91 | Temp 99.2°F | Resp 16 | Ht 65.5 in | Wt 207.0 lb

## 2015-11-08 DIAGNOSIS — R05 Cough: Secondary | ICD-10-CM

## 2015-11-08 DIAGNOSIS — J988 Other specified respiratory disorders: Secondary | ICD-10-CM

## 2015-11-08 DIAGNOSIS — J22 Unspecified acute lower respiratory infection: Secondary | ICD-10-CM

## 2015-11-08 DIAGNOSIS — R059 Cough, unspecified: Secondary | ICD-10-CM

## 2015-11-08 MED ORDER — AZITHROMYCIN 250 MG PO TABS: ORAL_TABLET | ORAL | Status: DC

## 2015-11-08 NOTE — Progress Notes (Signed)
Subjective:  By signing my name below, I, Moises Blood, attest that this documentation has been prepared under the direction and in the presence of Merri Ray, MD. Electronically Signed: Moises Blood, [place]. 11/08/2015 , 2:57 PM .  Patient was seen in Room 9 .   Patient ID: Bryan Hunt, male    DOB: January 24, 1961, 55 y.o.   MRN: [id number] Chief Complaint  Patient presents with  . Cough    x 4 days   . Headache  . Sore Throat   HPI Bryan Hunt is a 55 y.o. male Pt complains of cough with headache and sore throat that started 5 days ago. He initially thought it was just a cold with a scratchy throat. Yesterday, he noticed pressure in his head and sinus pressure with coughing up green sputum and chest congestion. He also felt chills with low grade fever starting yesterday and has worsened today. He informs having some neck and shoulder tightness. He denies much drainage from his nose. He denies shortness of breath.   There are no active problems to display for this patient.  Past Medical History  Diagnosis Date  . Allergy   . GERD (gastroesophageal reflux disease)    Past Surgical History  Procedure Laterality Date  . Cholecystectomy    . Vasectomy     Allergies  Allergen Reactions  . Dust Mite Extract    Prior to Admission medications   Medication Sig Start Date End Date Taking? Authorizing Provider  ciprofloxacin (CIPRO) 500 MG tablet Take 1 tablet (500 mg total) by mouth 2 (two) times daily. Patient not taking: Reported on 10/21/2015 03/14/15   Roselee Culver, MD  cyclobenzaprine (FLEXERIL) 5 MG tablet Take 1 tablet (5 mg total) by mouth at bedtime. Patient not taking: Reported on 11/08/2015 10/21/15   Robyn Haber, MD  loratadine (CLARITIN) 10 MG tablet Take 10 mg by mouth daily. Reported on 11/08/2015    Historical Provider, MD  meloxicam (MOBIC) 7.5 MG tablet Take 1 tablet (7.5 mg total) by mouth daily. Patient not taking: Reported on 11/08/2015 10/21/15    Robyn Haber, MD  metroNIDAZOLE (FLAGYL) 500 MG tablet Take 1 tablet (500 mg total) by mouth 4 (four) times daily. DO NOT CONSUME ALCOHOL WHILE TAKING THIS MEDICATION. Patient not taking: Reported on 10/21/2015 03/14/15   Roselee Culver, MD  PRESCRIPTION MEDICATION Reported on 11/08/2015    Historical Provider, MD   Social History   Social History  . Marital Status: Married    Spouse Name: N/A  . Number of Children: N/A  . Years of Education: N/A   Occupational History  . Not on file.   Social History Main Topics  . Smoking status: Never Smoker   . Smokeless tobacco: Never Used  . Alcohol Use: 6.0 oz/week    10 Glasses of wine per week     Comment: 5-10 drinks beer/wine per week per patient  . Drug Use: No  . Sexual Activity: Yes   Other Topics Concern  . Not on file   Social History Narrative   Married. Education: [employer].   Review of Systems  Constitutional: Positive for fever, chills and fatigue.  HENT: Positive for congestion, sinus pressure and sore throat. Negative for rhinorrhea and sneezing.   Respiratory: Positive for cough. Negative for shortness of breath and wheezing.   Musculoskeletal: Positive for myalgias (shoulders) and neck stiffness.  Neurological: Positive for headaches.      Objective:   Physical Exam  Constitutional: He is oriented  to person, place, and time. He appears well-developed and well-nourished. No distress.  HENT:  Head: Normocephalic and atraumatic.  Diffuse sinus tenderness  Eyes: EOM are normal. Pupils are equal, round, and reactive to light.  Neck: Neck supple.  Cardiovascular: Normal rate.   Pulmonary/Chest: Effort normal. No respiratory distress. He has decreased breath sounds (Faint coarse breath sounds left lower > right).  Musculoskeletal: Normal range of motion.  Neurological: He is alert and oriented to person, place, and time.  Skin: Skin is warm and dry.  Psychiatric: He has a normal mood and affect. His behavior is  normal.  Nursing note and vitals reviewed.   Filed Vitals:   11/08/15 1411  BP: 132/88  Pulse: 91  Temp: 99.2 F (37.3 C)  TempSrc: Oral  Resp: 16  Height: 5' 5.5" (1.664 m)  Weight: 207 lb (93.895 kg)  SpO2: 95%      Assessment & Plan:   Bryan Hunt is a 55 y.o. male LRTI (lower respiratory tract infection) - Plan: azithromycin (ZITHROMAX) 250 MG tablet  Cough - Plan: azithromycin (ZITHROMAX) 250 MG tablet  -cough with bronchitis or possible early CAP with few faint rhonchi. Afebrile in office, O2 sat ok. Imaging and labs deferred.   -Start Zpak, symptomatic care discussed, and RTC precautions on AVS.   Meds ordered this encounter  Medications  . azithromycin (ZITHROMAX) 250 MG tablet    Sig: Take 2 pills by mouth on day 1, then 1 pill by mouth per day on days 2 through 5.    Dispense:  6 tablet    Refill:  0   Patient Instructions  Start zpak, Saline nasal spray atleast 4 times per day, over the counter mucinex or mucinex DM fo cough, drink plenty of fluids. Recheck in next 2-3 days if not improving or continued worsening.   Return to the clinic or go to the nearest emergency room if any of your symptoms worsen or new symptoms occur.  Cough, Adult Coughing is a reflex that clears your throat and your airways. Coughing helps to heal and protect your lungs. It is normal to cough occasionally, but a cough that happens with other symptoms or lasts a long time may be a sign of a condition that needs treatment. A cough may last only 2-3 weeks (acute), or it may last longer than 8 weeks (chronic). CAUSES Coughing is commonly caused by:  Breathing in substances that irritate your lungs.  A viral or bacterial respiratory infection.  Allergies.  Asthma.  Postnasal drip.  Smoking.  Acid backing up from the stomach into the esophagus (gastroesophageal reflux).  Certain medicines.  Chronic lung problems, including COPD (or rarely, lung cancer).  Other medical  conditions such as heart failure. HOME CARE INSTRUCTIONS  Pay attention to any changes in your symptoms. Take these actions to help with your discomfort:  Take medicines only as told by your health care provider.  If you were prescribed an antibiotic medicine, take it as told by your health care provider. Do not stop taking the antibiotic even if you start to feel better.  Talk with your health care provider before you take a cough suppressant medicine.  Drink enough fluid to keep your urine clear or pale yellow.  If the air is dry, use a cold steam vaporizer or humidifier in your bedroom or your home to help loosen secretions.  Avoid anything that causes you to cough at work or at home.  If your cough is worse at night,  try sleeping in a semi-upright position.  Avoid cigarette smoke. If you smoke, quit smoking. If you need help quitting, ask your health care provider.  Avoid caffeine.  Avoid alcohol.  Rest as needed. SEEK MEDICAL CARE IF:   You have new symptoms.  You cough up pus.  Your cough does not get better after 2-3 weeks, or your cough gets worse.  You cannot control your cough with suppressant medicines and you are losing sleep.  You develop pain that is getting worse or pain that is not controlled with pain medicines.  You have a fever.  You have unexplained weight loss.  You have night sweats. SEEK IMMEDIATE MEDICAL CARE IF:  You cough up blood.  You have difficulty breathing.  Your heartbeat is very fast.   This information is not intended to replace advice given to you by your health care provider. Make sure you discuss any questions you have with your health care provider.   Document Released: 04/13/2011 Document Revised: 07/06/2015 Document Reviewed: 12/22/2014 Elsevier Interactive Patient Education [employer].     I personally performed the services described in this documentation, which was scribed in my presence. The recorded  information has been reviewed and considered, and addended by me as needed.

## 2015-11-08 NOTE — Patient Instructions (Signed)
Start zpak, Saline nasal spray atleast 4 times per day, over the counter mucinex or mucinex DM fo cough, drink plenty of fluids. Recheck in next 2-3 days if not improving or continued worsening.   Return to the clinic or go to the nearest emergency room if any of your symptoms worsen or new symptoms occur.  Cough, Adult Coughing is a reflex that clears your throat and your airways. Coughing helps to heal and protect your lungs. It is normal to cough occasionally, but a cough that happens with other symptoms or lasts a long time may be a sign of a condition that needs treatment. A cough may last only 2-3 weeks (acute), or it may last longer than 8 weeks (chronic). CAUSES Coughing is commonly caused by:  Breathing in substances that irritate your lungs.  A viral or bacterial respiratory infection.  Allergies.  Asthma.  Postnasal drip.  Smoking.  Acid backing up from the stomach into the esophagus (gastroesophageal reflux).  Certain medicines.  Chronic lung problems, including COPD (or rarely, lung cancer).  Other medical conditions such as heart failure. HOME CARE INSTRUCTIONS  Pay attention to any changes in your symptoms. Take these actions to help with your discomfort:  Take medicines only as told by your health care provider.  If you were prescribed an antibiotic medicine, take it as told by your health care provider. Do not stop taking the antibiotic even if you start to feel better.  Talk with your health care provider before you take a cough suppressant medicine.  Drink enough fluid to keep your urine clear or pale yellow.  If the air is dry, use a cold steam vaporizer or humidifier in your bedroom or your home to help loosen secretions.  Avoid anything that causes you to cough at work or at home.  If your cough is worse at night, try sleeping in a semi-upright position.  Avoid cigarette smoke. If you smoke, quit smoking. If you need help quitting, ask your health  care provider.  Avoid caffeine.  Avoid alcohol.  Rest as needed. SEEK MEDICAL CARE IF:   You have new symptoms.  You cough up pus.  Your cough does not get better after 2-3 weeks, or your cough gets worse.  You cannot control your cough with suppressant medicines and you are losing sleep.  You develop pain that is getting worse or pain that is not controlled with pain medicines.  You have a fever.  You have unexplained weight loss.  You have night sweats. SEEK IMMEDIATE MEDICAL CARE IF:  You cough up blood.  You have difficulty breathing.  Your heartbeat is very fast.   This information is not intended to replace advice given to you by your health care provider. Make sure you discuss any questions you have with your health care provider.   Document Released: 04/13/2011 Document Revised: 07/06/2015 Document Reviewed: 12/22/2014 Elsevier Interactive Patient Education [employer].

## 2016-09-08 DIAGNOSIS — Z23 Encounter for immunization: Secondary | ICD-10-CM | POA: Diagnosis not present

## 2017-01-29 ENCOUNTER — Ambulatory Visit (INDEPENDENT_AMBULATORY_CARE_PROVIDER_SITE_OTHER): Payer: BLUE CROSS/BLUE SHIELD | Admitting: Family Medicine

## 2017-01-29 VITALS — BP 125/76 | HR 71 | Temp 98.8°F | Resp 16 | Ht 65.75 in | Wt 215.8 lb

## 2017-01-29 DIAGNOSIS — Z23 Encounter for immunization: Secondary | ICD-10-CM

## 2017-01-29 DIAGNOSIS — R3911 Hesitancy of micturition: Secondary | ICD-10-CM

## 2017-01-29 DIAGNOSIS — M545 Low back pain, unspecified: Secondary | ICD-10-CM

## 2017-01-29 DIAGNOSIS — Z1159 Encounter for screening for other viral diseases: Secondary | ICD-10-CM | POA: Diagnosis not present

## 2017-01-29 DIAGNOSIS — Z0000 Encounter for general adult medical examination without abnormal findings: Secondary | ICD-10-CM

## 2017-01-29 DIAGNOSIS — Z13220 Encounter for screening for lipoid disorders: Secondary | ICD-10-CM

## 2017-01-29 DIAGNOSIS — Z125 Encounter for screening for malignant neoplasm of prostate: Secondary | ICD-10-CM | POA: Diagnosis not present

## 2017-01-29 DIAGNOSIS — Z131 Encounter for screening for diabetes mellitus: Secondary | ICD-10-CM | POA: Diagnosis not present

## 2017-01-29 LAB — POCT URINALYSIS DIP (MANUAL ENTRY)
BILIRUBIN UA: NEGATIVE
Bilirubin, UA: NEGATIVE
Glucose, UA: NEGATIVE
Leukocytes, UA: NEGATIVE
Nitrite, UA: NEGATIVE
PH UA: 6 (ref 5.0–8.0)
PROTEIN UA: NEGATIVE
RBC UA: NEGATIVE
SPEC GRAV UA: 1.025 (ref 1.030–1.035)
Urobilinogen, UA: 0.2 (ref ?–2.0)

## 2017-01-29 NOTE — Progress Notes (Signed)
Subjective:  By signing my name below, I, Essence Howell, attest that this documentation has been prepared under the direction and in the presence of Wendie Agreste, MD Electronically Signed: Ladene Artist, ED Scribe 01/29/2017 at 8:21 AM.   Patient ID: Bryan Hunt, male    DOB: 02-06-1961, 56 y.o.   MRN: [id number]  Chief Complaint  Patient presents with  . Annual Exam   HPI Bryan Hunt is a 56 y.o. male who presents to Primary Care at [facility] for an annual exam. Has a h/o allergies. Pt is fasting at this visit.    CA Screening Colonoscopy: July 2015 by Dr. Hilarie Fredrickson. 5 polyps removed. Tubular adenomas. Likely repeat due this year.  Prostate CA Screening: Pt reports intermittent urinary hesitancy and constant pressure in his prostate over the past year. He denies urinary frequency.   Lab Results  Component Value Date   PSA 0.99 04/12/2014   Immunizations  Immunization History  Administered Date(s) Administered  . Influenza-Unspecified 08/19/2013, 11/15/2014, 09/08/2016  . Zoster 09/08/2016  Tetanus: Last tetanus was in 2005.  Hep C Screening: He agrees to screening.  HIV Screening: Pt has never had a HIV screening; states he's pretty low risk. Declines at this visit and any other STI testing.  Does not appear he's had either.   Depression Screening  Depression screen [facility] 2/9 01/29/2017 11/08/2015 03/14/2015 04/12/2014  Decreased Interest 0 0 0 0  Down, Depressed, Hopeless 0 0 0 0  PHQ - 2 Score 0 0 0 0   Vision: Has not seen an optometrist in 2-3 years.   Visual Acuity Screening   Right eye Left eye Both eyes  Without correction: 20/20 20/30 20/20   With correction:      Dentist: He's followed by a dentist twice/year.  Exercise: Pt is practicing yoga twice weekly.   Back Pain Pt reports constant, mild, sharp low back pain that has gradually worsened over the past year. He states that back pain does not limit activities. He occasionally treats pain Aleve and does yoga twice  a week without significant relief. Pt also denies unexplained weight change, night sweats, chills, rash. 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   There are no active problems to display for this patient.  Past Medical History:  Diagnosis Date  . Allergy   . GERD (gastroesophageal reflux disease)    Past Surgical History:  Procedure Laterality Date  . CHOLECYSTECTOMY    . VASECTOMY     Allergies  Allergen Reactions  . Dust Mite Extract    Prior to Admission medications   Medication Sig Start Date End Date Taking? Authorizing Provider  cetirizine (ZYRTEC) 10 MG tablet Take 10 mg by mouth daily.   Yes Historical Provider, MD  azithromycin (ZITHROMAX) 250 MG tablet Take 2 pills by mouth on day 1, then 1 pill by mouth per day on days 2 through 5. Patient not taking: Reported on 01/29/2017 11/08/15   Wendie Agreste, MD  loratadine (CLARITIN) 10 MG tablet Take 10 mg by mouth daily. Reported on 11/08/2015    Historical Provider, MD  PRESCRIPTION MEDICATION Reported on 11/08/2015    Historical Provider, MD   Social History   Social History  . Marital status: Married    Spouse name: N/A  . Number of children: N/A  . Years of education: N/A   Occupational History  . Not on file.   Social History Main  Topics  . Smoking status: Never Smoker  . Smokeless tobacco: Never Used  . Alcohol use 6.0 oz/week    10 Glasses of wine per week     Comment: 5-10 drinks beer/wine per week per patient  . Drug use: No  . Sexual activity: Yes   Other Topics Concern  . Not on file   Social History Narrative   Married. Education: [employer].   Review of Systems  Constitutional: Negative for chills, diaphoresis and unexpected weight change.  Genitourinary: Negative for frequency.  Musculoskeletal: Positive for back pain.  Skin: Negative for rash.  All other systems reviewed and are negative.     Objective:   Physical Exam    Constitutional: He is oriented to person, place, and time. He appears well-developed and well-nourished.  HENT:  Head: Normocephalic and atraumatic.  Right Ear: Tympanic membrane and external ear normal.  Left Ear: Tympanic membrane and external ear normal.  Mouth/Throat: Oropharynx is clear and moist.  Eyes: Conjunctivae and EOM are normal. Pupils are equal, round, and reactive to light.  Neck: Normal range of motion. Neck supple. No thyromegaly present.  Cardiovascular: Normal rate, regular rhythm, normal heart sounds and intact distal pulses.   Pulmonary/Chest: Effort normal and breath sounds normal. No respiratory distress. He has no wheezes.  Abdominal: Soft. He exhibits no distension. There is no tenderness. Hernia confirmed negative in the right inguinal area and confirmed negative in the left inguinal area.  Genitourinary: Prostate normal.  Musculoskeletal: Normal range of motion. He exhibits no edema or tenderness.  No midline bony tenderness. Slight discomfort over lower lumbar paraspinal muscles. FROM of lumbar spine.   Lymphadenopathy:    He has no cervical adenopathy.  Neurological: He is alert and oriented to person, place, and time. He has normal reflexes.  Skin: Skin is warm and dry.  Psychiatric: He has a normal mood and affect. His behavior is normal.  Vitals reviewed.   Vitals:   01/29/17 0808  BP: 125/76  Pulse: 71  Resp: 16  Temp: 98.8 F (37.1 C)  TempSrc: Oral  SpO2: 96%  Weight: 215 lb 12.8 oz (97.9 kg)  Height: 5' 5.75" (1.67 m)      Assessment & Plan:    Bryan Hunt is a 56 y.o. male Annual physical exam  - -anticipatory guidance as below in AVS, screening labs above. Health maintenance items as above in HPI discussed/recommended as applicable.   Low back pain without sciatica, unspecified back pain laterality, unspecified chronicity  - no red flags on exam or history.   -Trial of home exercises, over-the-counter Tylenol or Advil/Aleve if  needed short term. Discussed weight loss as well as exercise. Recheck in 4-6 weeks if not improving.  Urinary hesitancy Screening for prostate cancer  - We discussed pros and cons of prostate cancer screening, and after this discussion, he chose to have screening done. PSA obtained, and no concerning findings on DRE. If hesitancy persists, and lab ok, return to discuss further. Avoid decongestants.   Screening for diabetes mellitus  - check CMP  Screening for hyperlipidemia  - check lipids.   Need for hepatitis C screening test  - check hep c antibody  Need for Tdap vaccination  - tdap given.   Meds ordered this encounter  Medications  . cetirizine (ZYRTEC) 10 MG tablet    Sig: Take 10 mg by mouth daily.   Patient Instructions   You appear to be due for repeat colonoscopy - call Dr. Vena Rua office to  schedule.   For back - see exercises, try Tylenol or Alleve if needed. If not improving in next 4-6 weeks, return for recehck and possible xrays. Sooner if worse.   I will check prostate test, but if urinary symptoms continue, return to discuss further.   If you take omeprazole everyday, then return to discuss further and to check some electrolytes that medicine can alter.    Keeping you healthy  Get these tests  Blood pressure- Have your blood pressure checked once a year by your healthcare provider.  Normal blood pressure is 120/80  Weight- Have your body mass index (BMI) calculated to screen for obesity.  BMI is a measure of body fat based on height and weight. You can also calculate your own BMI at ViewBanking.si.  Cholesterol- Have your cholesterol checked every year.  Diabetes- Have your blood sugar checked regularly if you have high blood pressure, high cholesterol, have a family history of diabetes or if you are overweight.  Screening for Colon Cancer- Colonoscopy starting at age 52.  Screening may begin sooner depending on your family history and other  health conditions. Follow up colonoscopy as directed by your Gastroenterologist.  Screening for Prostate Cancer- Both blood work (PSA) and a rectal exam help screen for Prostate Cancer.  Screening begins at age 51 with African-American men and at age 83 with Caucasian men.  Screening may begin sooner depending on your family history.  Take these medicines  Aspirin- One aspirin daily can help prevent Heart disease and Stroke.  Flu shot- Every fall.  Tetanus- Every 10 years.  Zostavax- Once after the age of 48 to prevent Shingles.  Pneumonia shot- Once after the age of 26; if you are younger than 73, ask your healthcare provider if you need a Pneumonia shot.  Take these steps  Don't smoke- If you do smoke, talk to your doctor about quitting.  For tips on how to quit, go to www.smokefree.gov or call 1-800-QUIT-NOW.  Be physically active- Exercise 5 days a week for at least 30 minutes.  If you are not already physically active start slow and gradually work up to 30 minutes of moderate physical activity.  Examples of moderate activity include walking briskly, mowing the yard, dancing, swimming, bicycling, etc.  Eat a healthy diet- Eat a variety of healthy food such as fruits, vegetables, low fat milk, low fat cheese, yogurt, lean meant, poultry, fish, beans, tofu, etc. For more information go to www.thenutritionsource.org  Drink alcohol in moderation- Limit alcohol intake to less than two drinks a day. Never drink and drive.  Dentist- Brush and floss twice daily; visit your dentist twice a year.  Depression- Your emotional health is as important as your physical health. If you're feeling down, or losing interest in things you would normally enjoy please talk to your healthcare provider.  Eye exam- Visit your eye doctor every year.  Safe sex- If you may be exposed to a sexually transmitted infection, use a condom.  Seat belts- Seat belts can save your life; always wear  one.  Smoke/Carbon Monoxide detectors- These detectors need to be installed on the appropriate level of your home.  Replace batteries at least once a year.  Skin cancer- When out in the sun, cover up and use sunscreen 15 SPF or higher.  Violence- If anyone is threatening you, please tell your healthcare provider.  Living Will/ Health care power of attorney- Speak with your healthcare provider and family.  Back Exercises The following exercises strengthen the  muscles that help to support the back. They also help to keep the lower back flexible. Doing these exercises can help to prevent back pain or lessen existing pain. If you have back pain or discomfort, try doing these exercises 2-3 times each day or as told by your health care provider. When the pain goes away, do them once each day, but increase the number of times that you repeat the steps for each exercise (do more repetitions). If you do not have back pain or discomfort, do these exercises once each day or as told by your health care provider. Exercises Single Knee to Chest   Repeat these steps 3-5 times for each leg: 7. Lie on your back on a firm bed or the floor with your legs extended. 8. Bring one knee to your chest. Your other leg should stay extended and in contact with the floor. 9. Hold your knee in place by grabbing your knee or thigh. 10. Pull on your knee until you feel a gentle stretch in your lower back. 11. Hold the stretch for 10-30 seconds. 12. Slowly release and straighten your leg. Pelvic Tilt   Repeat these steps 5-10 times: 6. Lie on your back on a firm bed or the floor with your legs extended. 7. Bend your knees so they are pointing toward the ceiling and your feet are flat on the floor. 8. Tighten your lower abdominal muscles to press your lower back against the floor. This motion will tilt your pelvis so your tailbone points up toward the ceiling instead of pointing to your feet or the floor. 9. With gentle  tension and even breathing, hold this position for 5-10 seconds. Cat-Cow   Repeat these steps until your lower back becomes more flexible: 14. Get into a hands-and-knees position on a firm surface. Keep your hands under your shoulders, and keep your knees under your hips. You may place padding under your knees for comfort. 15. Let your head hang down, and point your tailbone toward the floor so your lower back becomes rounded like the back of a cat. 16. Hold this position for 5 seconds. 17. Slowly lift your head and point your tailbone up toward the ceiling so your back forms a sagging arch like the back of a cow. 18. Hold this position for 5 seconds. Press-Ups   Repeat these steps 5-10 times: 1. Lie on your abdomen (face-down) on the floor. 2. Place your palms near your head, about shoulder-width apart. 3. While you keep your back as relaxed as possible and keep your hips on the floor, slowly straighten your arms to raise the top half of your body and lift your shoulders. Do not use your back muscles to raise your upper torso. You may adjust the placement of your hands to make yourself more comfortable. 4. Hold this position for 5 seconds while you keep your back relaxed. 5. Slowly return to lying flat on the floor. Bridges   Repeat these steps 10 times: 1. Lie on your back on a firm surface. 2. Bend your knees so they are pointing toward the ceiling and your feet are flat on the floor. 3. Tighten your buttocks muscles and lift your buttocks off of the floor until your waist is at almost the same height as your knees. You should feel the muscles working in your buttocks and the back of your thighs. If you do not feel these muscles, slide your feet 1-2 inches farther away from your buttocks. 4. Hold this  position for 3-5 seconds. 5. Slowly lower your hips to the starting position, and allow your buttocks muscles to relax completely. If this exercise is too easy, try doing it with your arms  crossed over your chest. Abdominal Crunches   Repeat these steps 5-10 times: 1. Lie on your back on a firm bed or the floor with your legs extended. 2. Bend your knees so they are pointing toward the ceiling and your feet are flat on the floor. 3. Cross your arms over your chest. 4. Tip your chin slightly toward your chest without bending your neck. 5. Tighten your abdominal muscles and slowly raise your trunk (torso) high enough to lift your shoulder blades a tiny bit off of the floor. Avoid raising your torso higher than that, because it can put too much stress on your low back and it does not help to strengthen your abdominal muscles. 6. Slowly return to your starting position. Back Lifts  Repeat these steps 5-10 times: 1. Lie on your abdomen (face-down) with your arms at your sides, and rest your forehead on the floor. 2. Tighten the muscles in your legs and your buttocks. 3. Slowly lift your chest off of the floor while you keep your hips pressed to the floor. Keep the back of your head in line with the curve in your back. Your eyes should be looking at the floor. 4. Hold this position for 3-5 seconds. 5. Slowly return to your starting position. Contact a health care provider if:  Your back pain or discomfort gets much worse when you do an exercise.  Your back pain or discomfort does not lessen within 2 hours after you exercise. If you have any of these problems, stop doing these exercises right away. Do not do them again unless your health care provider says that you can. Get help right away if:  You develop sudden, severe back pain. If this happens, stop doing the exercises right away. Do not do them again unless your health care provider says that you can. This information is not intended to replace advice given to you by your health care provider. Make sure you discuss any questions you have with your health care provider. Document Released: 11/22/2004 Document Revised: 02/22/2016  Document Reviewed: 12/09/2014 Elsevier Interactive Patient Education  2017 [employer].    IF you received an x-ray today, you will receive an invoice from [facility]. Please contact [facility] at [phone] with questions or concerns regarding your invoice.   IF you received labwork today, you will receive an invoice from [place]. Please contact [facility] at [phone] with questions or concerns regarding your invoice.   Our billing staff will not be able to assist you with questions regarding bills from these companies.  You will be contacted with the lab results as soon as they are available. The fastest way to get your results is to activate your My Chart account. Instructions are located on the last page of this paperwork. If you have not heard from [place] regarding the results in 2 weeks, please contact this office.       I personally performed the services described in this documentation, which was scribed in my presence. The recorded information has been reviewed and considered for accuracy and completeness, addended by me as needed, and agree with information above.  Signed,   Merri Ray, MD Primary [facility] at [place].  01/29/17 8:46 AM

## 2017-01-29 NOTE — Patient Instructions (Addendum)
You appear to be due for repeat colonoscopy - call Dr. Vena Rua office to schedule.   For back - see exercises, try Tylenol or Alleve if needed. If not improving in next 4-6 weeks, return for recehck and possible xrays. Sooner if worse.   I will check prostate test, but if urinary symptoms continue, return to discuss further.   If you take omeprazole everyday, then return to discuss further and to check some electrolytes that medicine can alter.    Keeping you healthy  Get these tests  Blood pressure- Have your blood pressure checked once a year by your healthcare provider.  Normal blood pressure is 120/80  Weight- Have your body mass index (BMI) calculated to screen for obesity.  BMI is a measure of body fat based on height and weight. You can also calculate your own BMI at ViewBanking.si.  Cholesterol- Have your cholesterol checked every year.  Diabetes- Have your blood sugar checked regularly if you have high blood pressure, high cholesterol, have a family history of diabetes or if you are overweight.  Screening for Colon Cancer- Colonoscopy starting at age 77.  Screening may begin sooner depending on your family history and other health conditions. Follow up colonoscopy as directed by your Gastroenterologist.  Screening for Prostate Cancer- Both blood work (PSA) and a rectal exam help screen for Prostate Cancer.  Screening begins at age 58 with African-American men and at age 20 with Caucasian men.  Screening may begin sooner depending on your family history.  Take these medicines  Aspirin- One aspirin daily can help prevent Heart disease and Stroke.  Flu shot- Every fall.  Tetanus- Every 10 years.  Zostavax- Once after the age of 58 to prevent Shingles.  Pneumonia shot- Once after the age of 47; if you are younger than 21, ask your healthcare provider if you need a Pneumonia shot.  Take these steps  Don't smoke- If you do smoke, talk to your doctor about quitting.   For tips on how to quit, go to www.smokefree.gov or call 1-800-QUIT-NOW.  Be physically active- Exercise 5 days a week for at least 30 minutes.  If you are not already physically active start slow and gradually work up to 30 minutes of moderate physical activity.  Examples of moderate activity include walking briskly, mowing the yard, dancing, swimming, bicycling, etc.  Eat a healthy diet- Eat a variety of healthy food such as fruits, vegetables, low fat milk, low fat cheese, yogurt, lean meant, poultry, fish, beans, tofu, etc. For more information go to www.thenutritionsource.org  Drink alcohol in moderation- Limit alcohol intake to less than two drinks a day. Never drink and drive.  Dentist- Brush and floss twice daily; visit your dentist twice a year.  Depression- Your emotional health is as important as your physical health. If you're feeling down, or losing interest in things you would normally enjoy please talk to your healthcare provider.  Eye exam- Visit your eye doctor every year.  Safe sex- If you may be exposed to a sexually transmitted infection, use a condom.  Seat belts- Seat belts can save your life; always wear one.  Smoke/Carbon Monoxide detectors- These detectors need to be installed on the appropriate level of your home.  Replace batteries at least once a year.  Skin cancer- When out in the sun, cover up and use sunscreen 15 SPF or higher.  Violence- If anyone is threatening you, please tell your healthcare provider.  Living Will/ Health care power of attorney- Speak with  your healthcare provider and family.  Back Exercises The following exercises strengthen the muscles that help to support the back. They also help to keep the lower back flexible. Doing these exercises can help to prevent back pain or lessen existing pain. If you have back pain or discomfort, try doing these exercises 2-3 times each day or as told by your health care provider. When the pain goes away,  do them once each day, but increase the number of times that you repeat the steps for each exercise (do more repetitions). If you do not have back pain or discomfort, do these exercises once each day or as told by your health care provider. Exercises Single Knee to Chest   Repeat these steps 3-5 times for each leg: 7. Lie on your back on a firm bed or the floor with your legs extended. 8. Bring one knee to your chest. Your other leg should stay extended and in contact with the floor. 9. Hold your knee in place by grabbing your knee or thigh. 10. Pull on your knee until you feel a gentle stretch in your lower back. 11. Hold the stretch for 10-30 seconds. 12. Slowly release and straighten your leg. Pelvic Tilt   Repeat these steps 5-10 times: 6. Lie on your back on a firm bed or the floor with your legs extended. 7. Bend your knees so they are pointing toward the ceiling and your feet are flat on the floor. 8. Tighten your lower abdominal muscles to press your lower back against the floor. This motion will tilt your pelvis so your tailbone points up toward the ceiling instead of pointing to your feet or the floor. 9. With gentle tension and even breathing, hold this position for 5-10 seconds. Cat-Cow   Repeat these steps until your lower back becomes more flexible: 14. Get into a hands-and-knees position on a firm surface. Keep your hands under your shoulders, and keep your knees under your hips. You may place padding under your knees for comfort. 15. Let your head hang down, and point your tailbone toward the floor so your lower back becomes rounded like the back of a cat. 16. Hold this position for 5 seconds. 17. Slowly lift your head and point your tailbone up toward the ceiling so your back forms a sagging arch like the back of a cow. 18. Hold this position for 5 seconds. Press-Ups   Repeat these steps 5-10 times: 1. Lie on your abdomen (face-down) on the floor. 2. Place your palms  near your head, about shoulder-width apart. 3. While you keep your back as relaxed as possible and keep your hips on the floor, slowly straighten your arms to raise the top half of your body and lift your shoulders. Do not use your back muscles to raise your upper torso. You may adjust the placement of your hands to make yourself more comfortable. 4. Hold this position for 5 seconds while you keep your back relaxed. 5. Slowly return to lying flat on the floor. Bridges   Repeat these steps 10 times: 1. Lie on your back on a firm surface. 2. Bend your knees so they are pointing toward the ceiling and your feet are flat on the floor. 3. Tighten your buttocks muscles and lift your buttocks off of the floor until your waist is at almost the same height as your knees. You should feel the muscles working in your buttocks and the back of your thighs. If you do not feel these  muscles, slide your feet 1-2 inches farther away from your buttocks. 4. Hold this position for 3-5 seconds. 5. Slowly lower your hips to the starting position, and allow your buttocks muscles to relax completely. If this exercise is too easy, try doing it with your arms crossed over your chest. Abdominal Crunches   Repeat these steps 5-10 times: 1. Lie on your back on a firm bed or the floor with your legs extended. 2. Bend your knees so they are pointing toward the ceiling and your feet are flat on the floor. 3. Cross your arms over your chest. 4. Tip your chin slightly toward your chest without bending your neck. 5. Tighten your abdominal muscles and slowly raise your trunk (torso) high enough to lift your shoulder blades a tiny bit off of the floor. Avoid raising your torso higher than that, because it can put too much stress on your low back and it does not help to strengthen your abdominal muscles. 6. Slowly return to your starting position. Back Lifts  Repeat these steps 5-10 times: 1. Lie on your abdomen (face-down) with  your arms at your sides, and rest your forehead on the floor. 2. Tighten the muscles in your legs and your buttocks. 3. Slowly lift your chest off of the floor while you keep your hips pressed to the floor. Keep the back of your head in line with the curve in your back. Your eyes should be looking at the floor. 4. Hold this position for 3-5 seconds. 5. Slowly return to your starting position. Contact a health care provider if:  Your back pain or discomfort gets much worse when you do an exercise.  Your back pain or discomfort does not lessen within 2 hours after you exercise. If you have any of these problems, stop doing these exercises right away. Do not do them again unless your health care provider says that you can. Get help right away if:  You develop sudden, severe back pain. If this happens, stop doing the exercises right away. Do not do them again unless your health care provider says that you can. This information is not intended to replace advice given to you by your health care provider. Make sure you discuss any questions you have with your health care provider. Document Released: 11/22/2004 Document Revised: 02/22/2016 Document Reviewed: 12/09/2014 Elsevier Interactive Patient Education  2017 [employer].    IF you received an x-ray today, you will receive an invoice from [facility]. Please contact [facility] at [phone] with questions or concerns regarding your invoice.   IF you received labwork today, you will receive an invoice from [facility]. Please contact [facility] at [phone] with questions or concerns regarding your invoice.   Our billing staff will not be able to assist you with questions regarding bills from these companies.  You will be contacted with the lab results as soon as they are available. The fastest way to get your results is to activate your My Chart account. Instructions are located on the last page of this paperwork. If you  have not heard from [place] regarding the results in 2 weeks, please contact this office.

## 2017-01-30 LAB — COMPREHENSIVE METABOLIC PANEL
ALBUMIN: 4.5 g/dL (ref 3.5–5.5)
ALT: 30 IU/L (ref 0–44)
AST: 21 IU/L (ref 0–40)
Albumin/Globulin Ratio: 1.8 (ref 1.2–2.2)
Alkaline Phosphatase: 76 IU/L (ref 39–117)
BILIRUBIN TOTAL: 0.4 mg/dL (ref 0.0–1.2)
BUN / CREAT RATIO: 13 (ref 9–20)
BUN: 13 mg/dL (ref 6–24)
CALCIUM: 9.3 mg/dL (ref 8.7–10.2)
CHLORIDE: 103 mmol/L (ref 96–106)
CO2: 21 mmol/L (ref 18–29)
Creatinine, Ser: 0.97 mg/dL (ref 0.76–1.27)
GFR, EST AFRICAN AMERICAN: 100 mL/min/{1.73_m2} (ref >59)
GFR, EST NON AFRICAN AMERICAN: 87 mL/min/{1.73_m2} (ref >59)
GLOBULIN, TOTAL: 2.5 g/dL (ref 1.5–4.5)
Glucose: 89 mg/dL (ref 65–99)
POTASSIUM: 4.4 mmol/L (ref 3.5–5.2)
SODIUM: 142 mmol/L (ref 134–144)
TOTAL PROTEIN: 7 g/dL (ref 6.0–8.5)

## 2017-01-30 LAB — PSA: Prostate Specific Ag, Serum: 1.1 ng/mL (ref 0.0–4.0)

## 2017-01-30 LAB — LIPID PANEL
CHOL/HDL RATIO: 4.5 ratio (ref 0.0–5.0)
Cholesterol, Total: 220 mg/dL — ABNORMAL HIGH (ref 100–199)
HDL: 49 mg/dL (ref >39)
LDL Calculated: 145 mg/dL — ABNORMAL HIGH (ref 0–99)
Triglycerides: 132 mg/dL (ref 0–149)
VLDL Cholesterol Cal: 26 mg/dL (ref 5–40)

## 2017-01-30 LAB — HEPATITIS C ANTIBODY

## 2017-02-21 ENCOUNTER — Encounter: Payer: Self-pay | Admitting: *Deleted

## 2017-02-28 ENCOUNTER — Encounter: Payer: Self-pay | Admitting: Family Medicine

## 2017-03-14 ENCOUNTER — Ambulatory Visit: Payer: Self-pay | Admitting: Family Medicine

## 2017-03-19 ENCOUNTER — Encounter: Payer: Self-pay | Admitting: Internal Medicine

## 2019-03-04 ENCOUNTER — Encounter (HOSPITAL_COMMUNITY): Payer: Self-pay | Admitting: Emergency Medicine

## 2019-03-04 ENCOUNTER — Emergency Department (HOSPITAL_COMMUNITY)
Admission: EM | Admit: 2019-03-04 | Discharge: 2019-03-04 | Disposition: A | Discharge status: Home / Self Care | Payer: BLUE CROSS/BLUE SHIELD | Attending: Emergency Medicine | Admitting: Emergency Medicine

## 2019-03-04 ENCOUNTER — Emergency Department (HOSPITAL_COMMUNITY): Payer: BLUE CROSS/BLUE SHIELD

## 2019-03-04 ENCOUNTER — Other Ambulatory Visit: Payer: Self-pay

## 2019-03-04 DIAGNOSIS — R0789 Other chest pain: Secondary | ICD-10-CM | POA: Insufficient documentation

## 2019-03-04 DIAGNOSIS — R079 Chest pain, unspecified: Secondary | ICD-10-CM

## 2019-03-04 DIAGNOSIS — M542 Cervicalgia: Secondary | ICD-10-CM | POA: Insufficient documentation

## 2019-03-04 DIAGNOSIS — R42 Dizziness and giddiness: Secondary | ICD-10-CM | POA: Diagnosis not present

## 2019-03-04 DIAGNOSIS — M25512 Pain in left shoulder: Secondary | ICD-10-CM | POA: Diagnosis present

## 2019-03-04 LAB — BASIC METABOLIC PANEL
Anion gap: 9 (ref 5–15)
BUN: 12 mg/dL (ref 6–20)
CO2: 22 mmol/L (ref 22–32)
Calcium: 9.3 mg/dL (ref 8.9–10.3)
Chloride: 107 mmol/L (ref 98–111)
Creatinine, Ser: 0.87 mg/dL (ref 0.61–1.24)
GFR calc Af Amer: >60 mL/min (ref >60)
GFR calc non Af Amer: >60 mL/min (ref >60)
Glucose, Bld: 86 mg/dL (ref 70–99)
Potassium: 4 mmol/L (ref 3.5–5.1)
Sodium: 138 mmol/L (ref 135–145)

## 2019-03-04 LAB — CBC
HCT: 44.3 % (ref 39.0–52.0)
Hemoglobin: 15.5 g/dL (ref 13.0–17.0)
MCH: 32 pg (ref 26.0–34.0)
MCHC: 35 g/dL (ref 30.0–36.0)
MCV: 91.3 fL (ref 80.0–100.0)
Platelets: 193 10*3/uL (ref 150–400)
RBC: 4.85 MIL/uL (ref 4.22–5.81)
RDW: 12.5 % (ref 11.5–15.5)
WBC: 6.5 10*3/uL (ref 4.0–10.5)
nRBC: 0 % (ref 0.0–0.2)

## 2019-03-04 LAB — TROPONIN I
Troponin I: <0.03 ng/mL (ref <0.03)
Troponin I: <0.03 ng/mL (ref <0.03)

## 2019-03-04 IMAGING — DX CHEST - 2 VIEW
2 series · 2 of 2 positions shown · non-contrast
Comparison: [DATE]

CLINICAL DATA: Chest pain and sudden onset of left arm and shoulder
pain.

EXAM:
CHEST - 2 VIEW

[w chest pa]
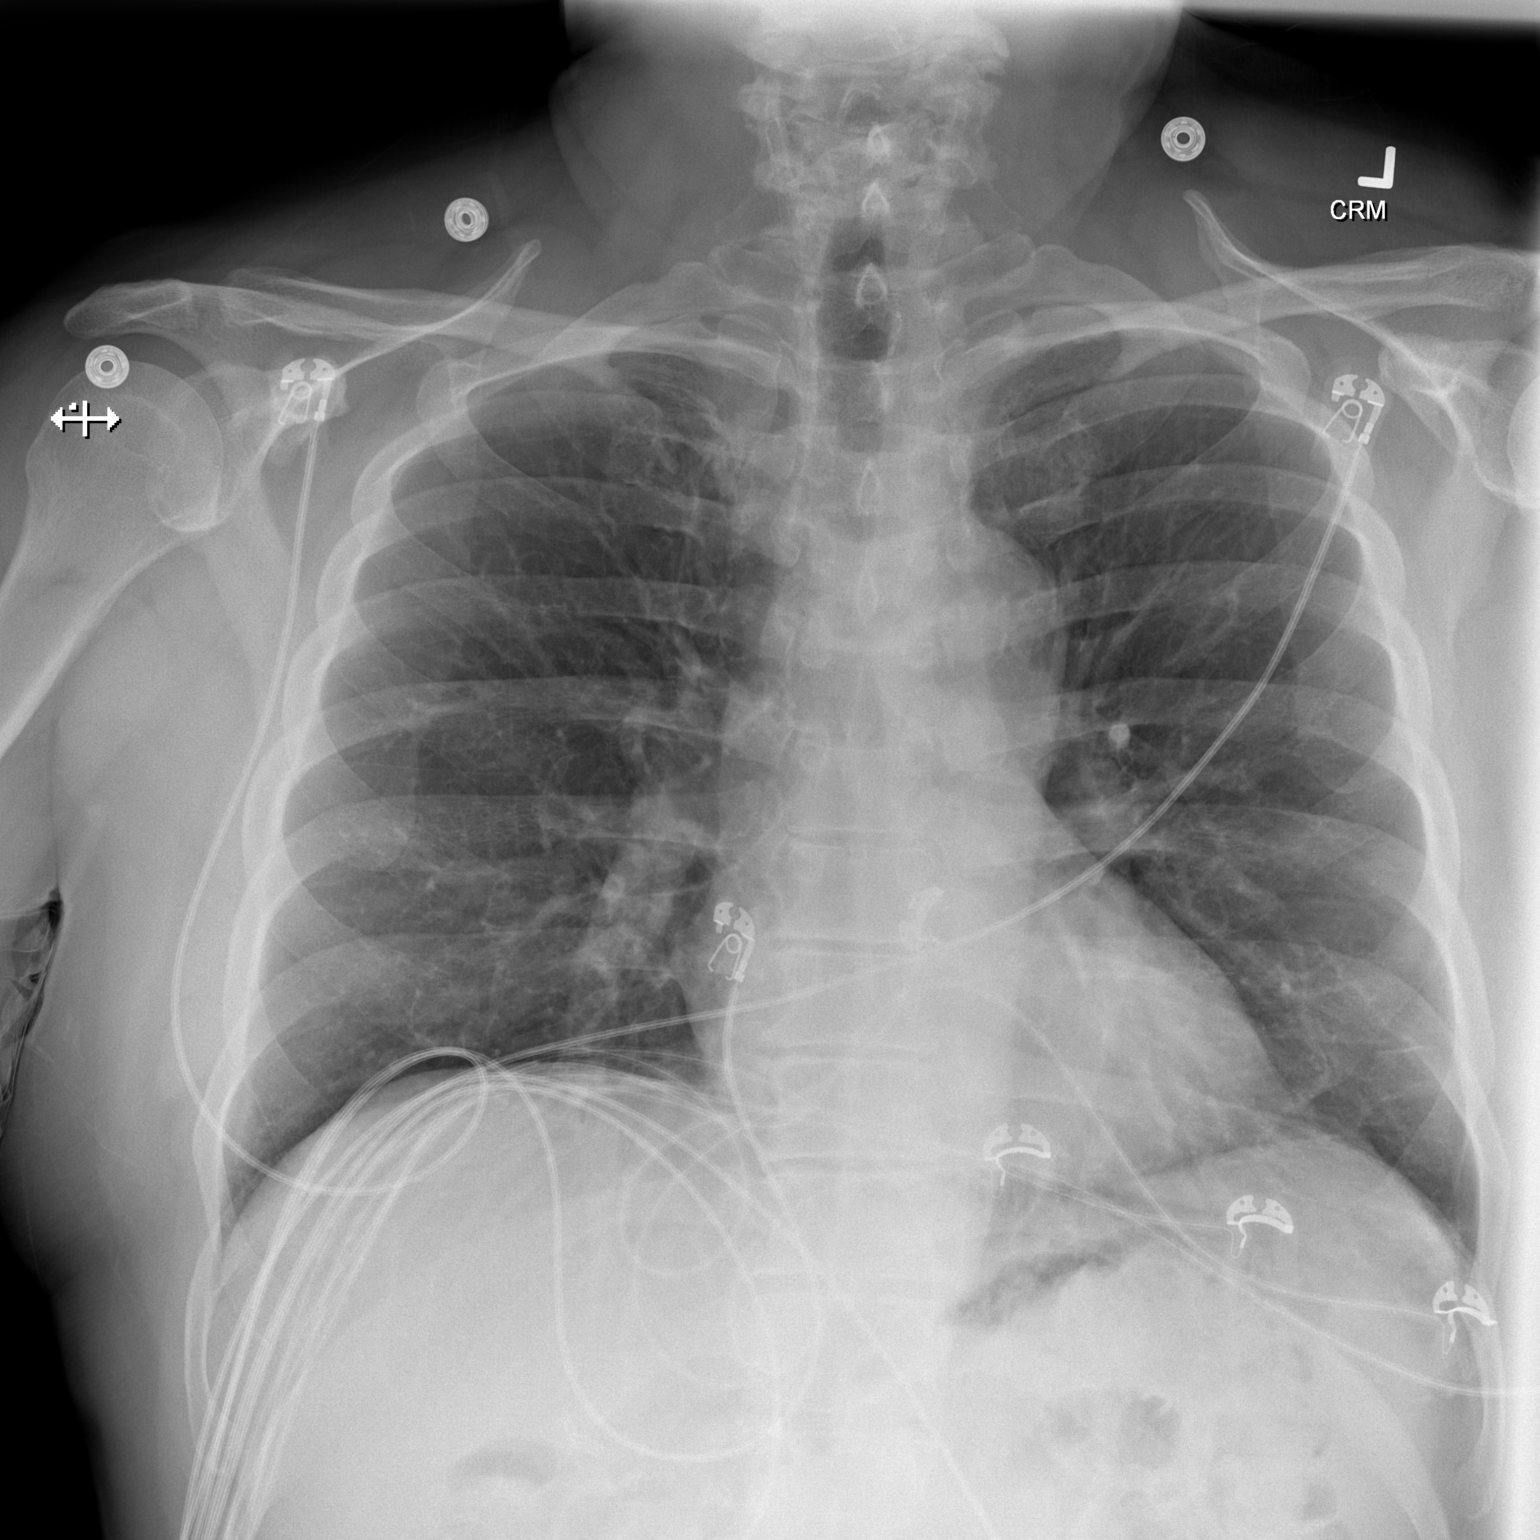

[w chest lat]
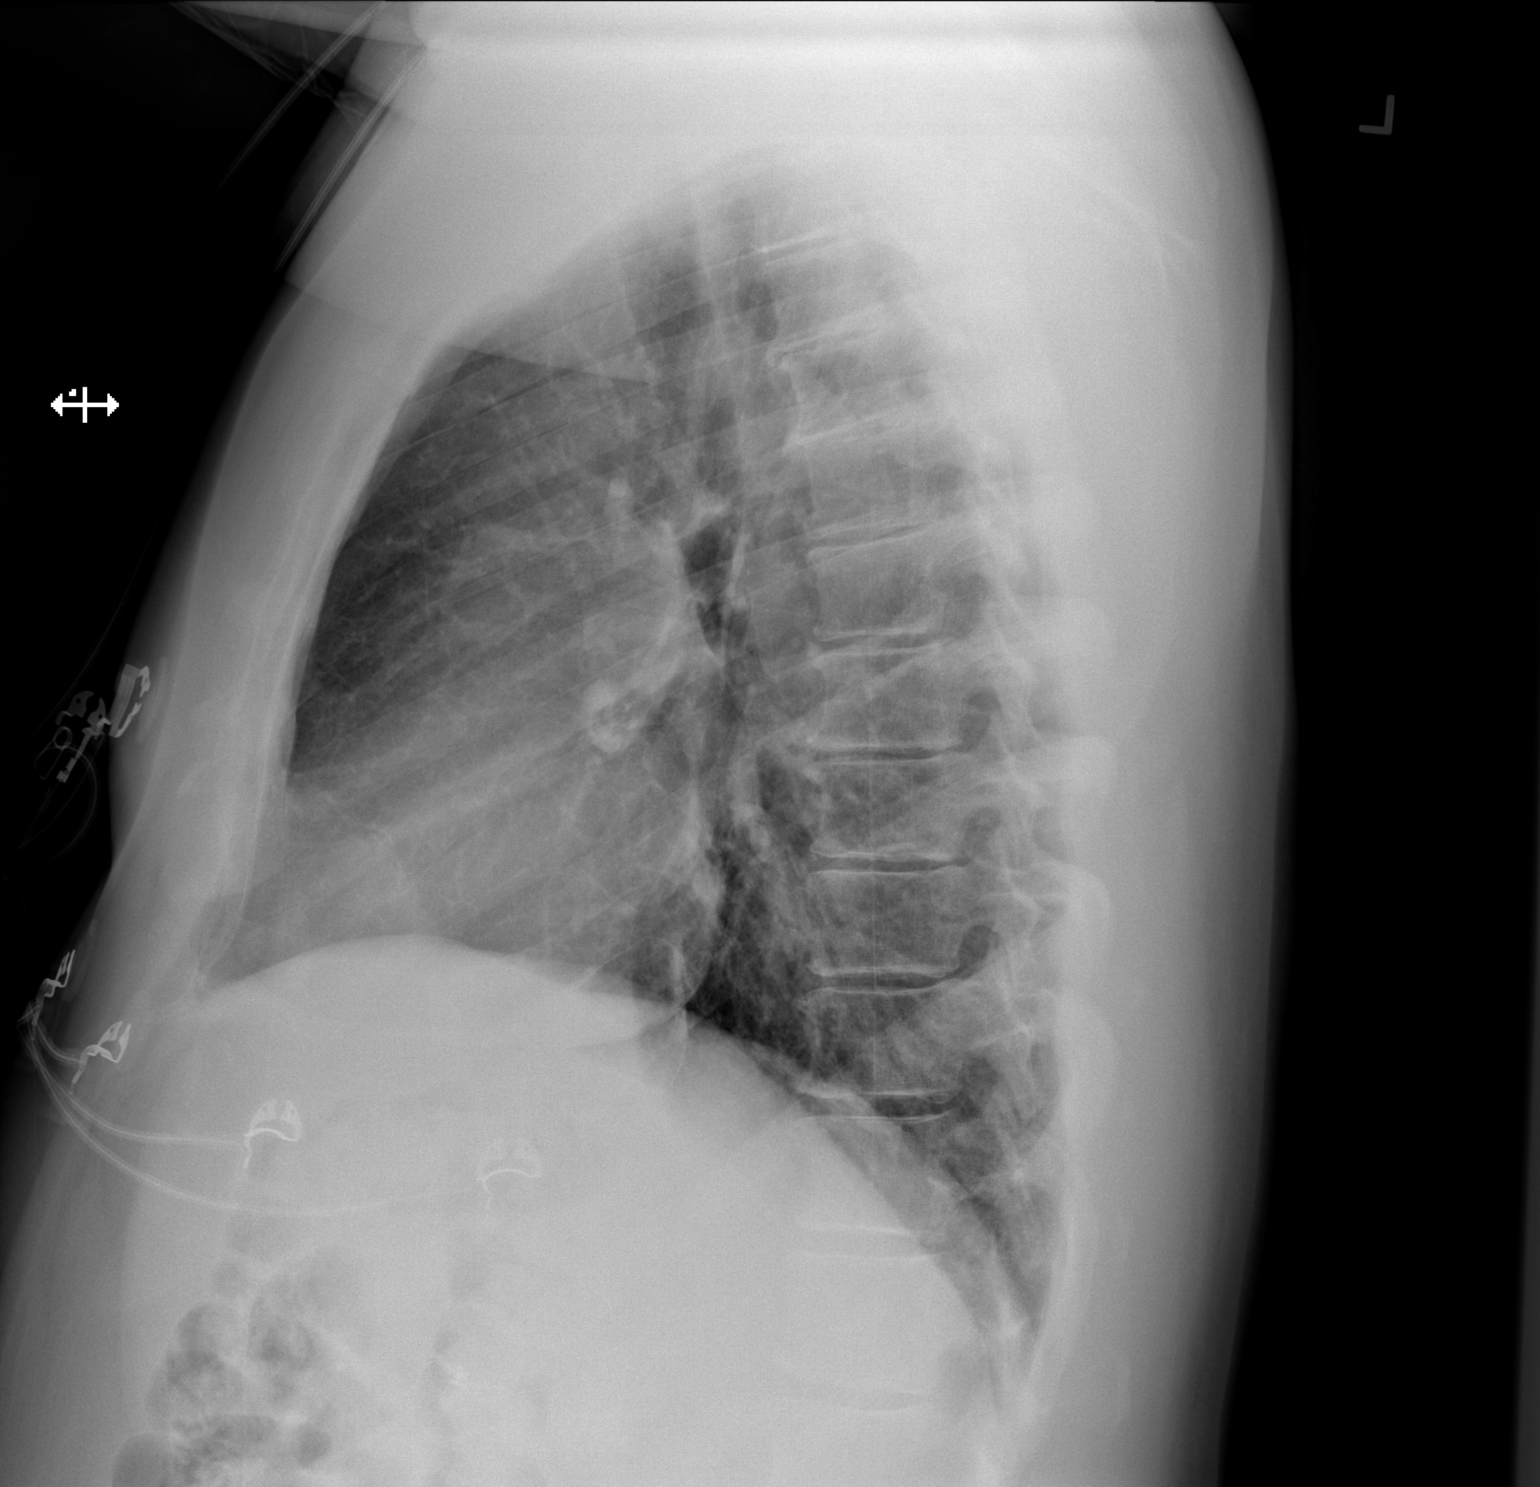

[2 of 2 positions shown; findings below may reference images not displayed]

FINDINGS: The heart size and mediastinal contours are within normal limits.
Both lungs are clear except for minimal linear scarring laterally at
the left lung base, unchanged. No pneumothorax. No effusions.

Prominent facet arthritis in the cervical spine.
IMPRESSION: No active cardiopulmonary disease. Prominent arthritic changes in
the lower cervical spine.

## 2019-03-04 MED ORDER — ASPIRIN 81 MG PO CHEW
324.0000 mg | CHEWABLE_TABLET | Freq: Once | ORAL | Status: AC
  Administered 2019-03-04: 12:00:00 324 mg via ORAL
  Filled 2019-03-04: qty 4

## 2019-03-04 NOTE — ED Provider Notes (Signed)
[facility] Emergency Department Provider Note MRN:  [id number]  Arrival date & time: 03/04/19     Chief Complaint   Arm Pain (Left) and Shoulder Pain (Left)   History of Present Illness   Bryan Hunt is a 58 y.o. year-old male with a history of GERD presenting to the ED with chief complaint of arm pain.  At about 10:45 AM, patient was on the phone talking with client.  Began experiencing sudden onset left shoulder pain, with radiation up the left side of the neck.  Moderate in severity, described as sharp, unrelenting.  Pain resolved after about 15 minutes while he was on the way to the emergency department.  Endorsing mild dizziness with pain, no diaphoresis, no nausea, no vomiting, no shortness of breath.  Denies chest pain.  Currently without symptoms.  Denies abdominal pain, no recent fever or cough, no recent trauma or new exertional activities to explain arm soreness.  Pain is not worsened with movement of the arm.  During the pain, he held the arm loose at his side, unsure if it was weak or if he was simply favoring the arm.  Review of Systems  A complete 10 system review of systems was obtained and all systems are negative except as noted in the HPI and PMH.   Patient's Health History    Past Medical History:  Diagnosis Date  . Allergy   . GERD (gastroesophageal reflux disease)     Past Surgical History:  Procedure Laterality Date  . CHOLECYSTECTOMY    . VASECTOMY      Family History  Problem Relation Age of Onset  . Lung cancer Father   . Colon cancer Neg Hx     Social History   Socioeconomic History  . Marital status: Married    Spouse name: Not on file  . Number of children: Not on file  . Years of education: Not on file  . Highest education level: Not on file  Occupational History  . Not on file  Social Needs  . Financial resource strain: Not on file  . Food insecurity:    Worry: Not on file    Inability: Not on file  . Transportation  needs:    Medical: Not on file    Non-medical: Not on file  Tobacco Use  . Smoking status: Never Smoker  . Smokeless tobacco: Never Used  Substance and Sexual Activity  . Alcohol use: Yes    Alcohol/week: 10.0 standard drinks    Types: 10 Glasses of wine per week    Comment: 5-10 drinks beer/wine per week per patient  . Drug use: No  . Sexual activity: Yes  Lifestyle  . Physical activity:    Days per week: Not on file    Minutes per session: Not on file  . Stress: Not on file  Relationships  . Social connections:    Talks on phone: Not on file    Gets together: Not on file    Attends religious service: Not on file    Active member of club or organization: Not on file    Attends meetings of clubs or organizations: Not on file    Relationship status: Not on file  . Intimate partner violence:    Fear of current or ex partner: Not on file    Emotionally abused: Not on file    Physically abused: Not on file    Forced sexual activity: Not on file  Other Topics Concern  .  Not on file  Social History Narrative   Married. Education: [employer].     Physical Exam  Vital Signs and Nursing Notes reviewed Vitals:   03/04/19 1134  BP: (!) 144/91  Pulse: 74  Resp: 17  Temp: 97.9 F (36.6 C)  SpO2: 94%    CONSTITUTIONAL: Well-appearing, NAD NEURO:  Alert and oriented x 3, normal and symmetric strength and sensation, no aphasia, no dysarthria, no neglect, no visual field cuts EYES:  eyes equal and reactive ENT/NECK:  no LAD, no JVD CARDIO: Regular rate, well-perfused, normal S1 and S2 PULM:  CTAB no wheezing or rhonchi GI/GU:  normal bowel sounds, non-distended, non-tender MSK/SPINE:  No gross deformities, no edema SKIN:  no rash, atraumatic PSYCH:  Appropriate speech and behavior  Diagnostic and Interventional Summary    EKG Interpretation  Date/Time:  Wednesday Mar 04 2019 11:36:12 EDT Ventricular Rate:  74 PR Interval:    QRS Duration: 98 QT Interval:  384 QTC  Calculation: 426 R Axis:   41 Text Interpretation:  Sinus rhythm Confirmed by Gerlene Fee [phone]) on 03/04/2019 11:40:33 AM      Labs Reviewed  CBC  BASIC METABOLIC PANEL  TROPONIN I    DG Chest 2 View    (Results Pending)    Medications  aspirin chewable tablet 324 mg (has no administration in time range)     Procedures Critical Care  ED Course and Medical Decision Making  I have reviewed the triage vital signs and the nursing notes.  Pertinent labs & imaging results that were available during my care of the patient were reviewed by me and considered in my medical decision making (see below for details).  Left arm pain that is not reproducible on exam, no trauma or new activities to explain a musculoskeletal etiology.  Considering referred pain with an underlying cardiac etiology.  EKG is without concerns, will need 2 troponins.  There was some question that maybe his arm felt weak during this episode of pain, however the concern for TIA or stroke is very low given the presence of pain in that same arm.  More likely to be just favoring the arm during the pain.  Patient is without chest pain, equal pulses to bilateral upper extremities, very low concern for dissection, chest x-ray pending.  Labs are reassuring, troponin negative, chest x-ray reassuring.  Second troponin is pending, care handed off to provider Martinique Robinson with the plan for cardiology consultation if positive, discharged to PCP if negative with plan to discuss need for outpatient stress testing with PCP.  Barth Kirks. Sedonia Small, MD [place] [email or web address]  Final Clinical Impressions(s) / ED Diagnoses     ICD-10-CM   1. Chest pain R07.9 DG Chest 2 View    DG Chest 2 View    ED Discharge Orders    None         Maudie Flakes, MD 03/04/19 [phone]

## 2019-03-04 NOTE — ED Triage Notes (Addendum)
Pt reports a sudden onset of left arm and shoulder pain that radiated into his neck. Pt reports this all started at approximately 1030 this date and lasted for about 10-15 mins. Pt denies any chest pain, nausea, vomiting, or diaphoresis. Pt reports the pain is gone now but at its peak it was a 10.

## 2019-03-04 NOTE — ED Provider Notes (Signed)
Patient transferred to yellow zone, care assumed from Dr.Bero.  See his note for full HPI and work-up.  Briefly, patient presenting with atypical chest pain, resolved while driving to the emergency department.  Patient has low heart score.  Plan to follow-up delta troponin.  If negative, safe for discharge with outpatient follow-up. Physical Exam  BP (!) 127/94   Pulse 67   Temp 97.9 F (36.6 C) (Oral)   Resp 19   Ht 5\' 8"  (1.727 m)   Wt 99.8 kg   SpO2 97%   BMI 33.45 kg/m   Physical Exam Vitals signs and nursing note reviewed.  Constitutional:      General: He is not in acute distress.    Appearance: He is well-developed.  HENT:     Head: Normocephalic and atraumatic.  Eyes:     Conjunctiva/sclera: Conjunctivae normal.  Cardiovascular:     Rate and Rhythm: Normal rate.  Pulmonary:     Effort: Pulmonary effort is normal.  Abdominal:     Palpations: Abdomen is soft.  Skin:    General: Skin is warm.  Neurological:     Mental Status: He is alert.  Psychiatric:        Behavior: Behavior normal.    Results for orders placed or performed during the hospital encounter of 03/04/19  CBC  Result Value Ref Range   WBC 6.5 4.0 - 10.5 K/uL   RBC 4.85 4.22 - 5.81 MIL/uL   Hemoglobin 15.5 13.0 - 17.0 g/dL   HCT 44.3 39.0 - 52.0 %   MCV 91.3 80.0 - 100.0 fL   MCH 32.0 26.0 - 34.0 pg   MCHC 35.0 30.0 - 36.0 g/dL   RDW 12.5 11.5 - 15.5 %   Platelets 193 150 - 400 K/uL   nRBC 0.0 0.0 - 0.2 %  Basic metabolic panel  Result Value Ref Range   Sodium 138 135 - 145 mmol/L   Potassium 4.0 3.5 - 5.1 mmol/L   Chloride 107 98 - 111 mmol/L   CO2 22 22 - 32 mmol/L   Glucose, Bld 86 70 - 99 mg/dL   BUN 12 6 - 20 mg/dL   Creatinine, Ser 0.87 0.61 - 1.24 mg/dL   Calcium 9.3 8.9 - 10.3 mg/dL   GFR calc non Af Amer >60 >60 mL/min   GFR calc Af Amer >60 >60 mL/min   Anion gap 9 5 - 15  Troponin I - ONCE - STAT  Result Value Ref Range   Troponin I <0.03 <0.03 ng/mL  Troponin I -  Once-Timed  Result Value Ref Range   Troponin I <0.03 <0.03 ng/mL   Dg Chest 2 View  Result Date: 03/04/2019 CLINICAL DATA:  Chest pain and sudden onset of left arm and shoulder pain. EXAM: CHEST - 2 VIEW COMPARISON:  12/06/2010 FINDINGS: The heart size and mediastinal contours are within normal limits. Both lungs are clear except for minimal linear scarring laterally at the left lung base, unchanged. No pneumothorax. No effusions. 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 IMPRESSION: No active cardiopulmonary disease. 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 Electronically Signed   By: Lorriane Shire M.D.   On: 03/04/2019 12:48    ED Course/Procedures     Procedures  MDM  Patient presenting with atypical chest pain, resolved upon arrival to the ED.  Low heart score.  Cardiac work-up is negative, negative delta troponin.  Patient will be discharged with instruction to follow close with PCP.  Patient  is in no distress and agreeable to plan.  Safe for discharge.  Discussed results, findings, treatment and follow up. Patient advised of return precautions. Patient verbalized understanding and agreed with plan.       Aliyanah Rozas, [place] N, PA-C 03/04/19 1625    Maudie Flakes, MD 03/06/19 1120

## 2019-03-04 NOTE — Discharge Instructions (Signed)
Please read instructions below. Follow up with your primary care provider in 2-3 days.  Return to the ER for new or worsening symptoms; including worsening chest pain, shortness of breath, pain that radiates to the arm or neck, pain or shortness of breath worsened with exertion.

## 2019-04-29 ENCOUNTER — Other Ambulatory Visit: Payer: Self-pay | Admitting: *Deleted

## 2019-04-29 DIAGNOSIS — Z20822 Contact with and (suspected) exposure to covid-19: Secondary | ICD-10-CM

## 2019-04-30 ENCOUNTER — Other Ambulatory Visit: Payer: BLUE CROSS/BLUE SHIELD

## 2019-04-30 DIAGNOSIS — R6889 Other general symptoms and signs: Secondary | ICD-10-CM | POA: Diagnosis not present

## 2019-04-30 DIAGNOSIS — Z20822 Contact with and (suspected) exposure to covid-19: Secondary | ICD-10-CM

## 2019-05-06 LAB — NOVEL CORONAVIRUS, NAA: SARS-CoV-2, NAA: NOT DETECTED

## 2019-09-09 ENCOUNTER — Other Ambulatory Visit: Payer: Self-pay

## 2019-09-09 ENCOUNTER — Ambulatory Visit (INDEPENDENT_AMBULATORY_CARE_PROVIDER_SITE_OTHER): Payer: BC Managed Care – PPO | Admitting: Registered Nurse

## 2019-09-09 ENCOUNTER — Encounter: Payer: Self-pay | Admitting: Registered Nurse

## 2019-09-09 VITALS — BP 128/83 | HR 75 | Temp 98.6°F | Resp 16 | Wt 213.0 lb

## 2019-09-09 DIAGNOSIS — H8113 Benign paroxysmal vertigo, bilateral: Secondary | ICD-10-CM

## 2019-09-09 DIAGNOSIS — Z23 Encounter for immunization: Secondary | ICD-10-CM

## 2019-09-09 MED ORDER — MECLIZINE HCL 25 MG PO TABS: 25.0000 mg | ORAL_TABLET | Freq: Three times a day (TID) | ORAL | 0 refills | Status: DC | PRN

## 2019-09-09 NOTE — Progress Notes (Signed)
Acute Office Visit  Subjective:    Patient ID: Bryan Hunt, male    DOB: 12-Jan-1961, 58 y.o.   MRN: [id number]  Chief Complaint  Patient presents with  . Gait Problem    pt states he noticed yesterday we was feeling off balance with some lightheadedness.   . Sinus Problem    pt states he also noticed some sinus pressure in the face and behind the eyes    HPI Patient is in today for unsteady gait and some sinus pressure  Unsteady gait started on Tuesday morning. Has been about the same since. States that he feels that his steps are more unsure - feels that he is moving rather than the room moving around him.  Notes that he had some sinus pressure as well in his maxillary sinuses bilaterally, but this is largely improved. He states that he does have severe seasonal allergies that require him to take loratidine daily.  He does note that he's had some word finding difficulty and trouble typing over the past year that is getting marginally worse - he is contributing this to stress and aging.  He does note that he drank heavily on Monday night and believed some of his symptoms on Tuesday to perhaps be related to being hungover - but now he is not as certain.  Past Medical History:  Diagnosis Date  . Allergy   . GERD (gastroesophageal reflux disease)     Past Surgical History:  Procedure Laterality Date  . CHOLECYSTECTOMY    . VASECTOMY      Family History  Problem Relation Age of Onset  . Lung cancer Father   . Colon cancer Neg Hx     Social History   Socioeconomic History  . Marital status: Married    Spouse name: Not on file  . Number of children: Not on file  . Years of education: Not on file  . Highest education level: Not on file  Occupational History  . Not on file  Social Needs  . Financial resource strain: Not on file  . Food insecurity    Worry: Not on file    Inability: Not on file  . Transportation needs    Medical: Not on file    Non-medical: Not on  file  Tobacco Use  . Smoking status: Never Smoker  . Smokeless tobacco: Never Used  Substance and Sexual Activity  . Alcohol use: Yes    Alcohol/week: 10.0 standard drinks    Types: 10 Glasses of wine per week    Comment: 5-10 drinks beer/wine per week per patient  . Drug use: No  . Sexual activity: Yes  Lifestyle  . Physical activity    Days per week: Not on file    Minutes per session: Not on file  . Stress: Not on file  Relationships  . Social Herbalist on phone: Not on file    Gets together: Not on file    Attends religious service: Not on file    Active member of club or organization: Not on file    Attends meetings of clubs or organizations: Not on file    Relationship status: Not on file  . Intimate partner violence    Fear of current or ex partner: Not on file    Emotionally abused: Not on file    Physically abused: Not on file    Forced sexual activity: Not on file  Other Topics Concern  . Not on file  Social History Narrative   Married. Education: [employer].    Outpatient Medications Prior to Visit  Medication Sig Dispense Refill  . loratadine (CLARITIN) 10 MG tablet Take 10 mg by mouth daily.     No facility-administered medications prior to visit.     Allergies  Allergen Reactions  . Dust Mite Extract Anaphylaxis    allergy    Review of Systems  Constitutional: Negative.   HENT: Positive for sinus pain. Negative for congestion, ear pain, hearing loss and sore throat.   Eyes: Negative.  Negative for blurred vision and double vision.  Respiratory: Negative.   Cardiovascular: Negative.   Gastrointestinal: Negative.   Genitourinary: Negative.   Musculoskeletal: Negative.   Skin: Negative.   Neurological: Positive for dizziness and headaches. Negative for tingling, tremors, sensory change, speech change, focal weakness, seizures, loss of consciousness and weakness.       Some word finding difficulties and difficulty typing over the past year   Endo/Heme/Allergies: Negative.   Psychiatric/Behavioral: Negative.        Objective:    Physical Exam  Constitutional: He is oriented to person, place, and time. He appears well-developed and well-nourished. No distress.  Cardiovascular: Normal rate and regular rhythm.  Pulmonary/Chest: Effort normal. No respiratory distress.  Neurological: He is alert and oriented to person, place, and time. He has normal strength. No cranial nerve deficit or sensory deficit. He displays a negative Romberg sign. Gait abnormal. Coordination normal. GCS eye subscore is 4. GCS verbal subscore is 5. GCS motor subscore is 6.  Negative romberg and pronator drift. Somewhat wobbling gait, but as this is my first time examining this patient, I have no baseline. Strength 5/5 bilaterally  Skin: Skin is warm and dry. No rash noted. He is not diaphoretic. No erythema. No pallor.  Psychiatric: He has a normal mood and affect. His behavior is normal. Judgment and thought content normal.  Nursing note and vitals reviewed.   BP 128/83   Pulse 75   Temp 98.6 F (37 C) (Oral)   Resp 16   Wt 213 lb (96.6 kg)   SpO2 95%   BMI 32.39 kg/m  Wt Readings from Last 3 Encounters:  09/09/19 213 lb (96.6 kg)  03/04/19 220 lb (99.8 kg)  01/29/17 215 lb 12.8 oz (97.9 kg)    Health Maintenance Due  Topic Date Due  . HIV Screening  08/02/1975  . COLONOSCOPY  05/11/2017    There are no preventive care reminders to display for this patient.   No results found for: TSH Lab Results  Component Value Date   WBC 6.5 03/04/2019   HGB 15.5 03/04/2019   HCT 44.3 03/04/2019   MCV 91.3 03/04/2019   PLT 193 03/04/2019   Lab Results  Component Value Date   NA 138 03/04/2019   K 4.0 03/04/2019   CO2 22 03/04/2019   GLUCOSE 86 03/04/2019   BUN 12 03/04/2019   CREATININE 0.87 03/04/2019   BILITOT 0.4 01/29/2017   ALKPHOS 76 01/29/2017   AST 21 01/29/2017   ALT 30 01/29/2017   PROT 7.0 01/29/2017   ALBUMIN 4.5  01/29/2017   CALCIUM 9.3 03/04/2019   ANIONGAP 9 03/04/2019   Lab Results  Component Value Date   CHOL 220 (H) 01/29/2017   Lab Results  Component Value Date   HDL 49 01/29/2017   Lab Results  Component Value Date   LDLCALC 145 (H) 01/29/2017   Lab Results  Component Value Date   TRIG 132  01/29/2017   Lab Results  Component Value Date   CHOLHDL 4.5 01/29/2017   No results found for: HGBA1C     Assessment & Plan:   Problem List Items Addressed This Visit    None    Visit Diagnoses    Need for influenza vaccination    -  Primary   Relevant Orders   Influenza (Seasonal)   Benign paroxysmal positional vertigo due to bilateral vestibular disorder       Relevant Medications   meclizine (ANTIVERT) 25 MG tablet       Meds ordered this encounter  Medications  . meclizine (ANTIVERT) 25 MG tablet    Sig: Take 1 tablet (25 mg total) by mouth 3 (three) times daily as needed for dizziness.    Dispense:  30 tablet    Refill:  0    Order Specific Question:   Supervising Provider    Answer:   Forrest Moron [id number]    PLAN  There are a number of etiologies to consider, but given his symptoms, will suggest meclizine for vertigo at this time with close follow up  Exam does not reveal infectious cause   Neuro exam largely normal except for some concern for gait, but as stated, I have no baseline on this patient.  Will consider ENT or Neuro referrals if patient has continuing symptoms that persist with meclizine.  Patient encouraged to call clinic with any questions, comments, or concerns.   [name], NP

## 2019-09-09 NOTE — Patient Instructions (Signed)
° ° ° °  If you have lab work done today you will be contacted with your lab results within the next 2 weeks.  If you have not heard from us then please contact us. The fastest way to get your results is to register for My Chart. ° ° °IF you received an x-ray today, you will receive an invoice from [facility]. Please contact [facility] at [phone] with questions or concerns regarding your invoice.  ° °IF you received labwork today, you will receive an invoice from [facility]. Please contact [facility] at [phone] with questions or concerns regarding your invoice.  ° °Our billing staff will not be able to assist you with questions regarding bills from these companies. ° °You will be contacted with the lab results as soon as they are available. The fastest way to get your results is to activate your My Chart account. Instructions are located on the last page of this paperwork. If you have not heard from us regarding the results in 2 weeks, please contact this office. °  ° ° ° °

## 2019-09-16 ENCOUNTER — Ambulatory Visit: Payer: BC Managed Care – PPO | Admitting: Registered Nurse

## 2019-09-30 ENCOUNTER — Ambulatory Visit: Payer: BC Managed Care – PPO | Admitting: Registered Nurse

## 2019-10-17 DIAGNOSIS — Z20828 Contact with and (suspected) exposure to other viral communicable diseases: Secondary | ICD-10-CM | POA: Diagnosis not present

## 2019-10-26 DIAGNOSIS — Z20828 Contact with and (suspected) exposure to other viral communicable diseases: Secondary | ICD-10-CM | POA: Diagnosis not present

## 2019-10-26 DIAGNOSIS — Z03818 Encounter for observation for suspected exposure to other biological agents ruled out: Secondary | ICD-10-CM | POA: Diagnosis not present

## 2019-11-20 DIAGNOSIS — Z20828 Contact with and (suspected) exposure to other viral communicable diseases: Secondary | ICD-10-CM | POA: Diagnosis not present

## 2019-12-01 ENCOUNTER — Other Ambulatory Visit: Payer: Self-pay | Admitting: Emergency Medicine

## 2019-12-01 ENCOUNTER — Telehealth: Payer: Self-pay | Admitting: Registered Nurse

## 2019-12-01 DIAGNOSIS — H8110 Benign paroxysmal vertigo, unspecified ear: Secondary | ICD-10-CM

## 2019-12-01 NOTE — Telephone Encounter (Signed)
  PAT WAS SEEN BY PROVIDER FOR BALANCE ISSUES WOULD LIKE A REFERRAL FOR ENT

## 2019-12-01 NOTE — Telephone Encounter (Signed)
Referral has been placed and sent to referral team.

## 2019-12-01 NOTE — Telephone Encounter (Signed)
Absolutely - that would be great.  Thank you,  [name], NP

## 2019-12-01 NOTE — Telephone Encounter (Signed)
Is it ok to put in a referral to ENT for patient for the vertigo

## 2019-12-03 DIAGNOSIS — R42 Dizziness and giddiness: Secondary | ICD-10-CM | POA: Diagnosis not present

## 2019-12-03 DIAGNOSIS — H9011 Conductive hearing loss, unilateral, right ear, with unrestricted hearing on the contralateral side: Secondary | ICD-10-CM | POA: Diagnosis not present

## 2019-12-09 DIAGNOSIS — R42 Dizziness and giddiness: Secondary | ICD-10-CM | POA: Diagnosis not present

## 2019-12-11 ENCOUNTER — Ambulatory Visit (INDEPENDENT_AMBULATORY_CARE_PROVIDER_SITE_OTHER): Payer: BLUE CROSS/BLUE SHIELD | Admitting: Family Medicine

## 2019-12-11 ENCOUNTER — Encounter: Payer: Self-pay | Admitting: Family Medicine

## 2019-12-11 ENCOUNTER — Other Ambulatory Visit: Payer: Self-pay

## 2019-12-11 VITALS — BP 130/84 | HR 58 | Temp 97.9°F | Ht 65.0 in | Wt 208.0 lb

## 2019-12-11 DIAGNOSIS — H5509 Other forms of nystagmus: Secondary | ICD-10-CM | POA: Diagnosis not present

## 2019-12-11 DIAGNOSIS — R030 Elevated blood-pressure reading, without diagnosis of hypertension: Secondary | ICD-10-CM | POA: Diagnosis not present

## 2019-12-11 DIAGNOSIS — R42 Dizziness and giddiness: Secondary | ICD-10-CM | POA: Diagnosis not present

## 2019-12-11 DIAGNOSIS — R5383 Other fatigue: Secondary | ICD-10-CM | POA: Diagnosis not present

## 2019-12-11 MED ORDER — MECLIZINE HCL 12.5 MG PO TABS: 12.5000 mg | ORAL_TABLET | Freq: Three times a day (TID) | ORAL | 0 refills | Status: DC | PRN

## 2019-12-11 NOTE — Patient Instructions (Addendum)
Blood pressure lower in office. Check BP on upper arm. See info below. Avoid adding salt to foods for now.  Trial meclizine low dose for now up to 3 times per day. I will discuss your symptoms with neurology, but follow up Monday.   Return to the clinic or go to the nearest emergency room if any of your symptoms worsen or new symptoms occur.   How to Take Your Blood Pressure Blood pressure is a measurement of how strongly your blood is pressing against the walls of your arteries. Arteries are blood vessels that carry blood from your heart throughout your body. Your health care provider takes your blood pressure at each office visit. You can also take your own blood pressure at home with a blood pressure machine. You may need to take your own blood pressure:  To confirm a diagnosis of high blood pressure (hypertension).  To monitor your blood pressure over time.  To make sure your blood pressure medicine is working. Supplies needed: To take your blood pressure, you will need a blood pressure machine. You can buy a blood pressure machine, or blood pressure monitor, at most drugstores or online. There are several types of home blood pressure monitors. When choosing one, consider the following:  Choose a monitor that has an arm cuff.  Choose a cuff that wraps snugly around your upper arm. You should be able to fit only one finger between your arm and the cuff.  Do not choose a monitor that measures your blood pressure from your wrist or finger. Your health care provider can suggest a reliable monitor that will meet your needs. How to prepare To get the most accurate reading, avoid the following for 30 minutes before you check your blood pressure:  Drinking caffeine.  Drinking alcohol.  Eating.  Smoking.  Exercising. Five minutes before you check your blood pressure:  Empty your bladder.  Sit quietly without talking in a dining chair, rather than in a soft couch or armchair. How to  take your blood pressure To check your blood pressure, follow the instructions in the manual that came with your blood pressure monitor. If you have a digital blood pressure monitor, the instructions may be as follows: 1. Sit up straight. 2. Place your feet on the floor. Do not cross your ankles or legs. 3. Rest your left arm at the level of your heart on a table or desk or on the arm of a chair. 4. Pull up your shirt sleeve. 5. Wrap the blood pressure cuff around the upper part of your left arm, 1 inch (2.5 cm) above your elbow. It is best to wrap the cuff around bare skin. 6. Fit the cuff snugly around your arm. You should be able to place only one finger between the cuff and your arm. 7. Position the cord inside the groove of your elbow. 8. Press the power button. 9. Sit quietly while the cuff inflates and deflates. 10. Read the digital reading on the monitor screen and write it down (record it). 11. Wait 2-3 minutes, then repeat the steps, starting at step 1. What does my blood pressure reading mean? A blood pressure reading consists of a higher number over a lower number. Ideally, your blood pressure should be below 120/80. The first ("top") number is called the systolic pressure. It is a measure of the pressure in your arteries as your heart beats. The second ("bottom") number is called the diastolic pressure. It is a measure of the pressure  in your arteries as the heart relaxes. Blood pressure is classified into four stages. The following are the stages for adults who do not have a short-term serious illness or a chronic condition. Systolic pressure and diastolic pressure are measured in a unit called mm Hg. Normal  Systolic pressure: below 123456.  Diastolic pressure: below 80. Elevated  Systolic pressure: [id number].  Diastolic pressure: below 80. Hypertension stage 1  Systolic pressure: 0000000.  Diastolic pressure: [id number]. Hypertension stage 2  Systolic pressure: XX123456 or  above.  Diastolic pressure: 90 or above. You can have prehypertension or hypertension even if only the systolic or only the diastolic number in your reading is higher than normal. Follow these instructions at home:  Check your blood pressure as often as recommended by your health care provider.  Take your monitor to the next appointment with your health care provider to make sure: ? That you are using it correctly. ? That it provides accurate readings.  Be sure you understand what your goal blood pressure numbers are.  Tell your health care provider if you are having any side effects from blood pressure medicine. Contact a health care provider if:  Your blood pressure is consistently high. Get help right away if:  Your systolic blood pressure is higher than 180.  Your diastolic blood pressure is higher than 110. This information is not intended to replace advice given to you by your health care provider. Make sure you discuss any questions you have with your health care provider. Document Revised: 09/27/2017 Document Reviewed: 03/23/2016 Elsevier Patient Education  [place].   Dizziness Dizziness is a common problem. It is a feeling of unsteadiness or light-headedness. You may feel like you are about to faint. Dizziness can lead to injury if you stumble or fall. Anyone can become dizzy, but dizziness is more common in older adults. This condition can be caused by a number of things, including medicines, dehydration, or illness. Follow these instructions at home: Eating and drinking  Drink enough fluid to keep your urine clear or pale yellow. This helps to keep you from becoming dehydrated. Try to drink more clear fluids, such as water.  Do not drink alcohol.  Limit your caffeine intake if told to do so by your health care provider. Check ingredients and nutrition facts to see if a food or beverage contains caffeine.  Limit your salt (sodium) intake if told to do so by  your health care provider. Check ingredients and nutrition facts to see if a food or beverage contains sodium. Activity  Avoid making quick movements. ? Rise slowly from chairs and steady yourself until you feel okay. ? In the morning, first sit up on the side of the bed. When you feel okay, stand slowly while you hold onto something until you know that your balance is fine.  If you need to stand in one place for a long time, move your legs often. Tighten and relax the muscles in your legs while you are standing.  Do not drive or use heavy machinery if you feel dizzy.  Avoid bending down if you feel dizzy. Place items in your home so that they are easy for you to reach without leaning over. Lifestyle  Do not use any products that contain nicotine or tobacco, such as cigarettes and e-cigarettes. If you need help quitting, ask your health care provider.  Try to reduce your stress level by using methods such as yoga or meditation. Talk with your health care provider  if you need help to manage your stress. General instructions  Watch your dizziness for any changes.  Take over-the-counter and prescription medicines only as told by your health care provider. Talk with your health care provider if you think that your dizziness is caused by a medicine that you are taking.  Tell a friend or a family member that you are feeling dizzy. If he or she notices any changes in your behavior, have this person call your health care provider.  Keep all follow-up visits as told by your health care provider. This is important. Contact a health care provider if:  Your dizziness does not go away.  Your dizziness or light-headedness gets worse.  You feel nauseous.  You have reduced hearing.  You have new symptoms.  You are unsteady on your feet or you feel like the room is spinning. Get help right away if:  You vomit or have diarrhea and are unable to eat or drink anything.  You have problems  talking, walking, swallowing, or using your arms, hands, or legs.  You feel generally weak.  You are not thinking clearly or you have trouble forming sentences. It may take a friend or family member to notice this.  You have chest pain, abdominal pain, shortness of breath, or sweating.  Your vision changes.  You have any bleeding.  You have a severe headache.  You have neck pain or a stiff neck.  You have a fever. These symptoms may represent a serious problem that is an emergency. Do not wait to see if the symptoms will go away. Get medical help right away. Call your local emergency services (911 in the [place] Do not drive yourself to the hospital. Summary  Dizziness is a feeling of unsteadiness or light-headedness. This condition can be caused by a number of things, including medicines, dehydration, or illness.  Anyone can become dizzy, but dizziness is more common in older adults.  Drink enough fluid to keep your urine clear or pale yellow. Do not drink alcohol.  Avoid making quick movements if you feel dizzy. Monitor your dizziness for any changes. This information is not intended to replace advice given to you by your health care provider. Make sure you discuss any questions you have with your health care provider. Document Revised: 10/18/2017 Document Reviewed: 11/17/2016 Elsevier Patient Education  [employer].   If you have lab work done today you will be contacted with your lab results within the next 2 weeks.  If you have not heard from [place] then please contact us. The fastest way to get your results is to register for My Chart.   IF you received an x-ray today, you will receive an invoice from [facility]. Please contact [facility] at [phone] with questions or concerns regarding your invoice.   IF you received labwork today, you will receive an invoice from [facility]. Please contact [facility] at [phone] with questions or concerns  regarding your invoice.   Our billing staff will not be able to assist you with questions regarding bills from these companies.  You will be contacted with the lab results as soon as they are available. The fastest way to get your results is to activate your My Chart account. Instructions are located on the last page of this paperwork. If you have not heard from [place] regarding the results in 2 weeks, please contact this office.

## 2019-12-11 NOTE — Progress Notes (Signed)
Subjective:  Patient ID: Bryan Hunt, male    DOB: 07/14/1961  Age: 59 y.o. MRN: [id number]  CC:  Chief Complaint  Patient presents with  . Hypertension    pt has some concerns about his BP. pt states he can hear his heart beating in his ear.pt also states he feels alot of presure in his head. pt has been seen by an ENT for vertigo. pt brought in his home BP checks BP seem to bounce around.    HPI [name] presents for   Elevated blood pressure:  Elevated readings noted recently.  BP had been doing well in past. No meds.  Alcohol: cut back past week, prior 1-2 finger bourbon, 1- 2 per night or wine - total about 14 drinks per night.  Has been doing Whole 30 diet since December. Some increased salt - added to most foods with cooking.  Cut back on salt past 2 days.  Weight improving - max 213 when started home scale 200.  Wt Readings from Last 3 Encounters:  12/11/19 208 lb (94.3 kg)  09/09/19 213 lb (96.6 kg)  03/04/19 220 lb (99.8 kg)  Recent readings - wrist monitor: 2/11 - 155/87, 153/85 Today - 138-159/77-85.   Vertigo symptoms in November - seen by [name] - treated with meclizine 2 times - did not like way he felt.  Improved in 1 week.   Vertigo feeling in mid January - saw ENT Dr [name] last week. BP 144/80-90?  Outer ear looked ok - felt unsteady - off balance, but worse with sudden movement of head. No focal weakness, no slurred speech, no facial droop. Feel like leaning left.  Had chair test 2 days ago to look at inner ear  -initially thought may have BPPV, then PT eval that day - told may not be BPPV after further exam, but still possible inner ear issue. [name] Has not yet had follow up with Dr. [name].  Plans to fly to [place] in 5 days for 6 day trip.   No recent chest pain/palpitations. Some increased fatigue lately - sleeping more at night.    Feeling heartbeat in ears at times. Not constant. Comes and goes since mid January.no loss of hearing.    Heaviness/pressure sensation in head - slight. Not worst HA of life.  No diplopia/blurring.     BP Readings from Last 3 Encounters:  12/11/19 130/84  09/09/19 128/83  03/04/19 (!) 121/97     History There are no problems to display for this patient.  Past Medical History:  Diagnosis Date  . Allergy   . GERD (gastroesophageal reflux disease)    Past Surgical History:  Procedure Laterality Date  . CHOLECYSTECTOMY    . VASECTOMY     Allergies  Allergen Reactions  . Dust Mite Extract Anaphylaxis    allergy   Prior to Admission medications   Medication Sig Start Date End Date Taking? Authorizing Provider  loratadine (CLARITIN) 10 MG tablet Take 10 mg by mouth daily.   Yes [provider]  meclizine (ANTIVERT) 25 MG tablet Take 1 tablet (25 mg total) by mouth 3 (three) times daily as needed for dizziness. Patient not taking: Reported on 12/11/2019 09/09/19   [name], NP   Social History   Socioeconomic History  . Marital status: Married    Spouse name: Not on file  . Number of children: Not on file  . Years of education: Not on file  . Highest education level: Not on file  Occupational History  . Not on file  Tobacco Use  . Smoking status: Never Smoker  . Smokeless tobacco: Never Used  Substance and Sexual Activity  . Alcohol use: Yes    Alcohol/week: 10.0 standard drinks    Types: 10 Glasses of wine per week    Comment: 5-10 drinks beer/wine per week per patient  . Drug use: No  . Sexual activity: Yes  Other Topics Concern  . Not on file  Social History Narrative   Married. Education: [employer].   Social Determinants of Health   Financial Resource Strain:   . Difficulty of Paying Living Expenses: Not on file  Food Insecurity:   . Worried About Charity fundraiser in the Last Year: Not on file  . Ran Out of Food in the Last Year: Not on file  Transportation Needs:   . Lack of Transportation (Medical): Not on file  . Lack of  Transportation (Non-Medical): Not on file  Physical Activity:   . Days of Exercise per Week: Not on file  . Minutes of Exercise per Session: Not on file  Stress:   . Feeling of Stress : Not on file  Social Connections:   . Frequency of Communication with Friends and Family: Not on file  . Frequency of Social Gatherings with Friends and Family: Not on file  . Attends Religious Services: Not on file  . Active Member of Clubs or Organizations: Not on file  . Attends Archivist Meetings: Not on file  . Marital Status: Not on file  Intimate Partner Violence:   . Fear of Current or Ex-Partner: Not on file  . Emotionally Abused: Not on file  . Physically Abused: Not on file  . Sexually Abused: Not on file    Review of Systems  Per HPI  Objective:   Vitals:   12/11/19 1619 12/11/19 1624  BP: (!) 138/94 130/84  Pulse: (!) 58   Temp: 97.9 F (36.6 C)   TempSrc: Temporal   SpO2: 96%   Weight: 208 lb (94.3 kg)   Height: 5\' 5"  (1.651 m)     Physical Exam Vitals reviewed.  Constitutional:      Appearance: He is well-developed.  HENT:     Head: Normocephalic and atraumatic.     Ears:     Comments: Mastoids nontender, no carotid bruits.  External ear canals clear, no apparent effusion bilaterally. Eyes:     Extraocular Movements:     Right eye: Nystagmus (Bilateral rotational nystagmus, fast component counterclockwise with supine exam.  No appreciable vertical nystagmus.  With sitting, minimal left fast component nystagmus with looking left.) present.     Left eye: Nystagmus present.     Pupils: Pupils are equal, round, and reactive to light.  Neck:     Vascular: No carotid bruit or JVD.  Cardiovascular:     Rate and Rhythm: Normal rate and regular rhythm.     Heart sounds: Normal heart sounds. No murmur.  Pulmonary:     Effort: Pulmonary effort is normal.     Breath sounds: Normal breath sounds. No rales.  Skin:    General: Skin is warm and dry.  Neurological:      General: No focal deficit present.     Mental Status: He is alert and oriented to person, place, and time.     GCS: GCS eye subscore is 4. GCS verbal subscore is 5. GCS motor subscore is 6.     Cranial Nerves: No cranial  nerve deficit, dysarthria or facial asymmetry.     Motor: Motor function is intact. No weakness, tremor, atrophy, seizure activity or pronator drift.     Coordination: Romberg sign negative. Coordination normal. Heel to [facility] Test normal. Rapid alternating movements normal.     Gait: Gait is intact.     Comments: Gait intact, slight shift to the left when he first stood up, but normal gait on testing.  No focal weakness appreciated.      Assessment & Plan:  Halen Kilby is a 59 y.o. male . Elevated blood pressure reading - Plan: Basic metabolic panel, CBC, TSH  -Mild elevated home readings, but those have been done on the wrist monitor, may not be reliable.  Check BMP, CBC, home monitoring's with cough, recheck 3 days.  Dizziness - Plan: Basic metabolic panel, CBC, TSH, meclizine (ANTIVERT) 12.5 MG tablet Fatigue, unspecified type - Plan: Basic metabolic panel, CBC, TSH Rotational nystagmus - Plan: meclizine (ANTIVERT) 12.5 MG tablet  -Improvement in symptoms in November after 1 week, recurrence few weeks ago, status post ENT eval as above.  Does have positional worsening with supine rotatory nystagmus, some horizontal nystagmus sitting.  Nonfocal exam otherwise as above.  Appears to be most likely peripheral vertigo, unlikely CVA, but with new onset symptoms will consider MRI brain to evaluate posterior circulation as well as MRA head and neck.  Briefly discussed with neurology.  Plan for in office recheck with in 3 days to discuss this imaging and further plans.  ER/911 precautions given.  No orders of the defined types were placed in this encounter.  Patient Instructions   Blood pressure lower in office. Check BP on upper arm. See info below. Avoid adding salt to  foods for now.  Trial meclizine low dose for now up to 3 times per day. I will discuss your symptoms with neurology, but follow up Monday.   Return to the clinic or go to the nearest emergency room if any of your symptoms worsen or new symptoms occur.   How to Take Your Blood Pressure Blood pressure is a measurement of how strongly your blood is pressing against the walls of your arteries. Arteries are blood vessels that carry blood from your heart throughout your body. Your health care provider takes your blood pressure at each office visit. You can also take your own blood pressure at home with a blood pressure machine. You may need to take your own blood pressure:  To confirm a diagnosis of high blood pressure (hypertension).  To monitor your blood pressure over time.  To make sure your blood pressure medicine is working. Supplies needed: To take your blood pressure, you will need a blood pressure machine. You can buy a blood pressure machine, or blood pressure monitor, at most drugstores or online. There are several types of home blood pressure monitors. When choosing one, consider the following:  Choose a monitor that has an arm cuff.  Choose a cuff that wraps snugly around your upper arm. You should be able to fit only one finger between your arm and the cuff.  Do not choose a monitor that measures your blood pressure from your wrist or finger. Your health care provider can suggest a reliable monitor that will meet your needs. How to prepare To get the most accurate reading, avoid the following for 30 minutes before you check your blood pressure:  Drinking caffeine.  Drinking alcohol.  Eating.  Smoking.  Exercising. Five minutes before you check your blood  pressure:  Empty your bladder.  Sit quietly without talking in a dining chair, rather than in a soft couch or armchair. How to take your blood pressure To check your blood pressure, follow the instructions in the  manual that came with your blood pressure monitor. If you have a digital blood pressure monitor, the instructions may be as follows: 1. Sit up straight. 2. Place your feet on the floor. Do not cross your ankles or legs. 3. Rest your left arm at the level of your heart on a table or desk or on the arm of a chair. 4. Pull up your shirt sleeve. 5. Wrap the blood pressure cuff around the upper part of your left arm, 1 inch (2.5 cm) above your elbow. It is best to wrap the cuff around bare skin. 6. Fit the cuff snugly around your arm. You should be able to place only one finger between the cuff and your arm. 7. Position the cord inside the groove of your elbow. 8. Press the power button. 9. Sit quietly while the cuff inflates and deflates. 10. Read the digital reading on the monitor screen and write it down (record it). 11. Wait 2-3 minutes, then repeat the steps, starting at step 1. What does my blood pressure reading mean? A blood pressure reading consists of a higher number over a lower number. Ideally, your blood pressure should be below 120/80. The first ("top") number is called the systolic pressure. It is a measure of the pressure in your arteries as your heart beats. The second ("bottom") number is called the diastolic pressure. It is a measure of the pressure in your arteries as the heart relaxes. Blood pressure is classified into four stages. The following are the stages for adults who do not have a short-term serious illness or a chronic condition. Systolic pressure and diastolic pressure are measured in a unit called mm Hg. Normal  Systolic pressure: below 123456.  Diastolic pressure: below 80. Elevated  Systolic pressure: [id number].  Diastolic pressure: below 80. Hypertension stage 1  Systolic pressure: 0000000.  Diastolic pressure: [id number]. Hypertension stage 2  Systolic pressure: XX123456 or above.  Diastolic pressure: 90 or above. You can have prehypertension or hypertension even if  only the systolic or only the diastolic number in your reading is higher than normal. Follow these instructions at home:  Check your blood pressure as often as recommended by your health care provider.  Take your monitor to the next appointment with your health care provider to make sure: ? That you are using it correctly. ? That it provides accurate readings.  Be sure you understand what your goal blood pressure numbers are.  Tell your health care provider if you are having any side effects from blood pressure medicine. Contact a health care provider if:  Your blood pressure is consistently high. Get help right away if:  Your systolic blood pressure is higher than 180.  Your diastolic blood pressure is higher than 110. This information is not intended to replace advice given to you by your health care provider. Make sure you discuss any questions you have with your health care provider. Document Revised: 09/27/2017 Document Reviewed: 03/23/2016 Elsevier Patient Education  [place].   Dizziness Dizziness is a common problem. It is a feeling of unsteadiness or light-headedness. You may feel like you are about to faint. Dizziness can lead to injury if you stumble or fall. Anyone can become dizzy, but dizziness is more common in older adults. This  condition can be caused by a number of things, including medicines, dehydration, or illness. Follow these instructions at home: Eating and drinking  Drink enough fluid to keep your urine clear or pale yellow. This helps to keep you from becoming dehydrated. Try to drink more clear fluids, such as water.  Do not drink alcohol.  Limit your caffeine intake if told to do so by your health care provider. Check ingredients and nutrition facts to see if a food or beverage contains caffeine.  Limit your salt (sodium) intake if told to do so by your health care provider. Check ingredients and nutrition facts to see if a food or beverage  contains sodium. Activity  Avoid making quick movements. ? Rise slowly from chairs and steady yourself until you feel okay. ? In the morning, first sit up on the side of the bed. When you feel okay, stand slowly while you hold onto something until you know that your balance is fine.  If you need to stand in one place for a long time, move your legs often. Tighten and relax the muscles in your legs while you are standing.  Do not drive or use heavy machinery if you feel dizzy.  Avoid bending down if you feel dizzy. Place items in your home so that they are easy for you to reach without leaning over. Lifestyle  Do not use any products that contain nicotine or tobacco, such as cigarettes and e-cigarettes. If you need help quitting, ask your health care provider.  Try to reduce your stress level by using methods such as yoga or meditation. Talk with your health care provider if you need help to manage your stress. General instructions  Watch your dizziness for any changes.  Take over-the-counter and prescription medicines only as told by your health care provider. Talk with your health care provider if you think that your dizziness is caused by a medicine that you are taking.  Tell a friend or a family member that you are feeling dizzy. If he or she notices any changes in your behavior, have this person call your health care provider.  Keep all follow-up visits as told by your health care provider. This is important. Contact a health care provider if:  Your dizziness does not go away.  Your dizziness or light-headedness gets worse.  You feel nauseous.  You have reduced hearing.  You have new symptoms.  You are unsteady on your feet or you feel like the room is spinning. Get help right away if:  You vomit or have diarrhea and are unable to eat or drink anything.  You have problems talking, walking, swallowing, or using your arms, hands, or legs.  You feel generally  weak.  You are not thinking clearly or you have trouble forming sentences. It may take a friend or family member to notice this.  You have chest pain, abdominal pain, shortness of breath, or sweating.  Your vision changes.  You have any bleeding.  You have a severe headache.  You have neck pain or a stiff neck.  You have a fever. These symptoms may represent a serious problem that is an emergency. Do not wait to see if the symptoms will go away. Get medical help right away. Call your local emergency services (911 in the [place] Do not drive yourself to the hospital. Summary  Dizziness is a feeling of unsteadiness or light-headedness. This condition can be caused by a number of things, including medicines, dehydration, or illness.  Anyone  can become dizzy, but dizziness is more common in older adults.  Drink enough fluid to keep your urine clear or pale yellow. Do not drink alcohol.  Avoid making quick movements if you feel dizzy. Monitor your dizziness for any changes. This information is not intended to replace advice given to you by your health care provider. Make sure you discuss any questions you have with your health care provider. Document Revised: 10/18/2017 Document Reviewed: 11/17/2016 Elsevier Patient Education  [employer].   If you have lab work done today you will be contacted with your lab results within the next 2 weeks.  If you have not heard from [place] then please contact us. The fastest way to get your results is to register for My Chart.   IF you received an x-ray today, you will receive an invoice from [facility]. Please contact [facility] at [phone] with questions or concerns regarding your invoice.   IF you received labwork today, you will receive an invoice from [facility]. Please contact [facility] at [phone] with questions or concerns regarding your invoice.   Our billing staff will not be able to assist you with questions  regarding bills from these companies.  You will be contacted with the lab results as soon as they are available. The fastest way to get your results is to activate your My Chart account. Instructions are located on the last page of this paperwork. If you have not heard from [place] regarding the results in 2 weeks, please contact this office.         Signed, Merri Ray, MD [facility]

## 2019-12-12 LAB — TSH: TSH: 2.39 u[IU]/mL (ref 0.450–4.500)

## 2019-12-12 LAB — BASIC METABOLIC PANEL
BUN/Creatinine Ratio: 15 (ref 9–20)
BUN: 14 mg/dL (ref 6–24)
CO2: 23 mmol/L (ref 20–29)
Calcium: 9.5 mg/dL (ref 8.7–10.2)
Chloride: 101 mmol/L (ref 96–106)
Creatinine, Ser: 0.92 mg/dL (ref 0.76–1.27)
GFR calc Af Amer: 105 mL/min/{1.73_m2} (ref >59)
GFR calc non Af Amer: 91 mL/min/{1.73_m2} (ref >59)
Glucose: 84 mg/dL (ref 65–99)
Potassium: 3.9 mmol/L (ref 3.5–5.2)
Sodium: 138 mmol/L (ref 134–144)

## 2019-12-12 LAB — CBC
Hematocrit: 45.4 % (ref 37.5–51.0)
Hemoglobin: 16.2 g/dL (ref 13.0–17.7)
MCH: 31.6 pg (ref 26.6–33.0)
MCHC: 35.7 g/dL (ref 31.5–35.7)
MCV: 89 fL (ref 79–97)
Platelets: 208 10*3/uL (ref 150–450)
RBC: 5.12 x10E6/uL (ref 4.14–5.80)
RDW: 12 % (ref 11.6–15.4)
WBC: 6.6 10*3/uL (ref 3.4–10.8)

## 2019-12-13 ENCOUNTER — Encounter: Payer: Self-pay | Admitting: Family Medicine

## 2019-12-14 ENCOUNTER — Telehealth: Payer: Self-pay | Admitting: Family Medicine

## 2019-12-14 ENCOUNTER — Encounter: Payer: Self-pay | Admitting: Family Medicine

## 2019-12-14 ENCOUNTER — Ambulatory Visit (INDEPENDENT_AMBULATORY_CARE_PROVIDER_SITE_OTHER): Payer: BLUE CROSS/BLUE SHIELD | Admitting: Family Medicine

## 2019-12-14 ENCOUNTER — Other Ambulatory Visit: Payer: Self-pay

## 2019-12-14 VITALS — BP 128/86 | HR 71 | Temp 97.4°F | Ht 65.0 in | Wt 208.0 lb

## 2019-12-14 DIAGNOSIS — R519 Headache, unspecified: Secondary | ICD-10-CM | POA: Diagnosis not present

## 2019-12-14 DIAGNOSIS — Z20828 Contact with and (suspected) exposure to other viral communicable diseases: Secondary | ICD-10-CM | POA: Diagnosis not present

## 2019-12-14 DIAGNOSIS — R42 Dizziness and giddiness: Secondary | ICD-10-CM | POA: Diagnosis not present

## 2019-12-14 DIAGNOSIS — H5509 Other forms of nystagmus: Secondary | ICD-10-CM | POA: Diagnosis not present

## 2019-12-14 MED ORDER — MECLIZINE HCL 12.5 MG PO TABS: 12.5000 mg | ORAL_TABLET | Freq: Three times a day (TID) | ORAL | 0 refills | Status: DC | PRN

## 2019-12-14 NOTE — Patient Instructions (Addendum)
Glad to hear the symptoms are improving.  I will check to see if we can get an MRI and MRA scheduled today or tomorrow.  Continue meclizine 3 times daily as needed.  Blood pressure also looks good today.  If any new or worsening symptoms be seen right away.   If you have lab work done today you will be contacted with your lab results within the next 2 weeks.  If you have not heard from [place] then please contact us. The fastest way to get your results is to register for My Chart.   IF you received an x-ray today, you will receive an invoice from [facility]. Please contact [facility]iology at [phone] with questions or concerns regarding your invoice.   IF you received labwork today, you will receive an invoice from [facility]. Please contact [facility] at [phone] with questions or concerns regarding your invoice.   Our billing staff will not be able to assist you with questions regarding bills from these companies.  You will be contacted with the lab results as soon as they are available. The fastest way to get your results is to activate your My Chart account. Instructions are located on the last page of this paperwork. If you have not heard from [place] regarding the results in 2 weeks, please contact this office.

## 2019-12-14 NOTE — Telephone Encounter (Signed)
Imaging have been declined.  Peer to peer is needed Please call [phone]  Plan number is [id number]

## 2019-12-15 ENCOUNTER — Ambulatory Visit (HOSPITAL_COMMUNITY)
Admission: RE | Admit: 2019-12-15 | Discharge: 2019-12-15 | Disposition: A | Discharge status: Home / Self Care | Payer: BC Managed Care – PPO | Source: Ambulatory Visit | Attending: Family Medicine | Admitting: Family Medicine

## 2019-12-15 DIAGNOSIS — R519 Headache, unspecified: Secondary | ICD-10-CM | POA: Insufficient documentation

## 2019-12-15 DIAGNOSIS — R42 Dizziness and giddiness: Secondary | ICD-10-CM | POA: Diagnosis not present

## 2019-12-15 DIAGNOSIS — H5509 Other forms of nystagmus: Secondary | ICD-10-CM | POA: Diagnosis not present

## 2019-12-15 IMAGING — MR MR MRA HEAD W/O CM
1 series · 19 of 48 positions shown · non-contrast
Comparison: Brain MRI and neck MRA today reported separately.

CLINICAL DATA: 58-year-old male with dizziness, rotational
nystagmus. Symptom onset in [REDACTED].

EXAM:
MRA HEAD WITHOUT CONTRAST
TECHNIQUE: Angiographic images of the Circle of Willis were obtained using MRA
technique without intravenous contrast.

[Series 2: ax (id) · axial · 1.0mm · 0.43mm/px · z∈[-85,-4]mm · 19 of 184 slices shown]
[im 1/184]
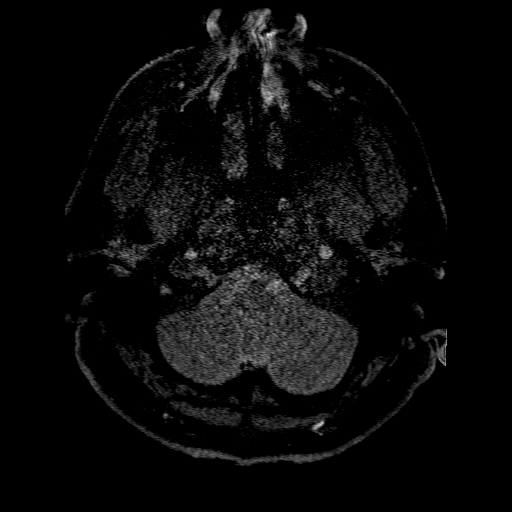
[im 4/184]
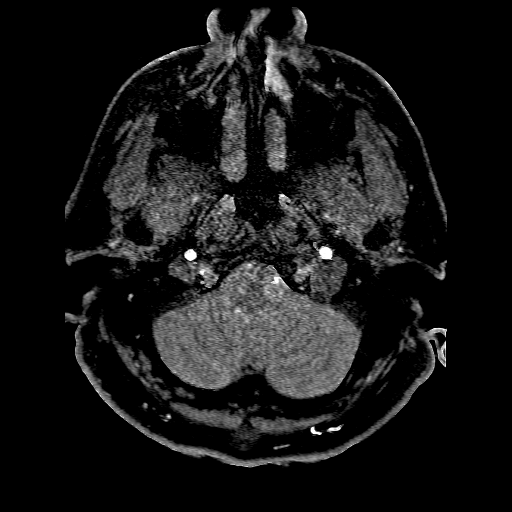
[im 8/184]
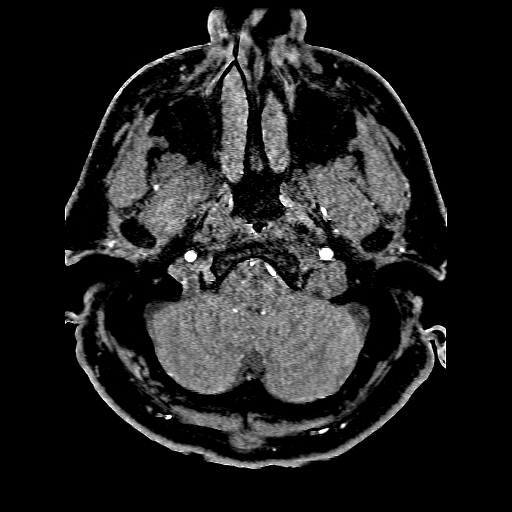
[im 12/184]
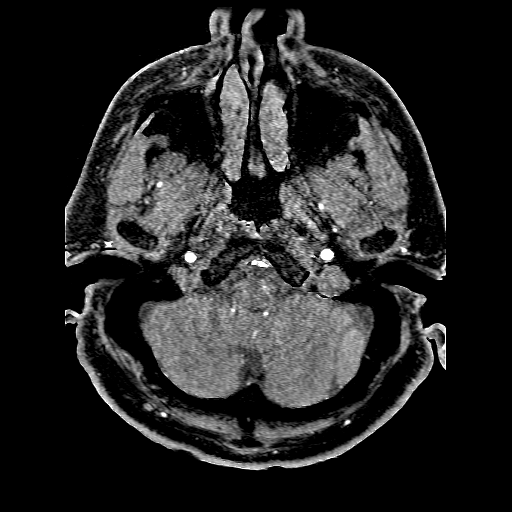
[im 16/184]
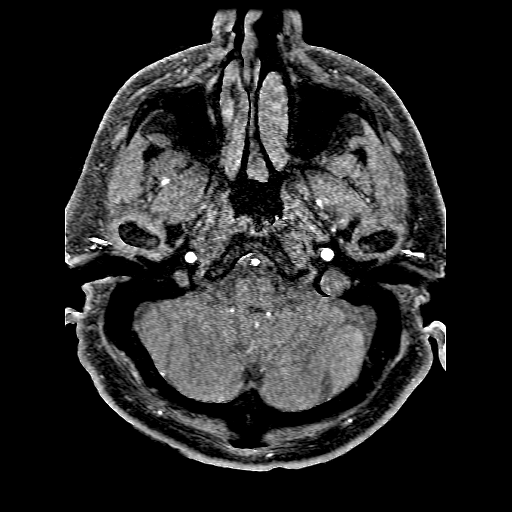
[im 20/184]
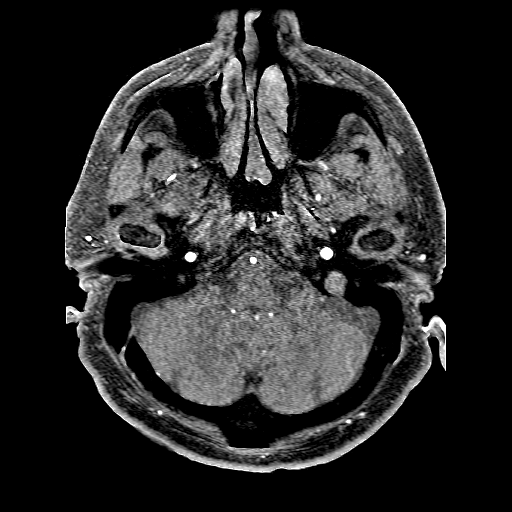
[im 24/184]
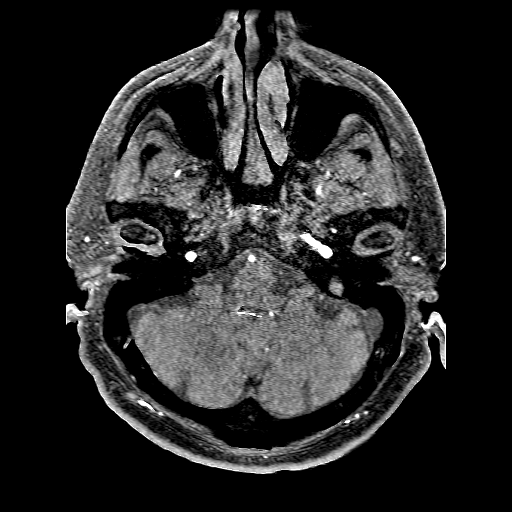
[im 28/184]
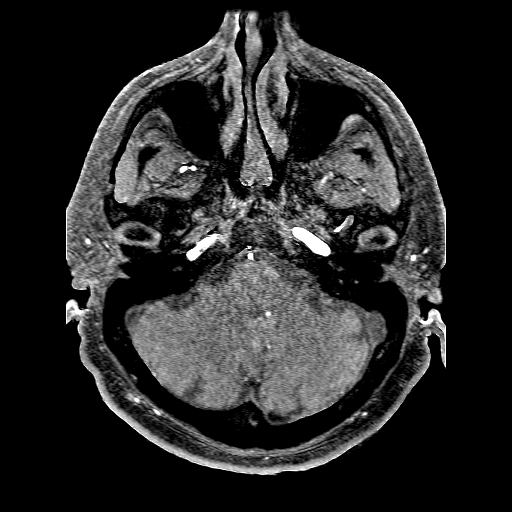
[im 32/184]
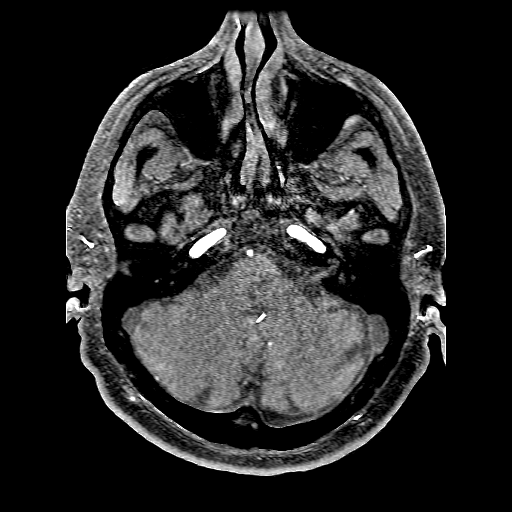
[im 36/184]
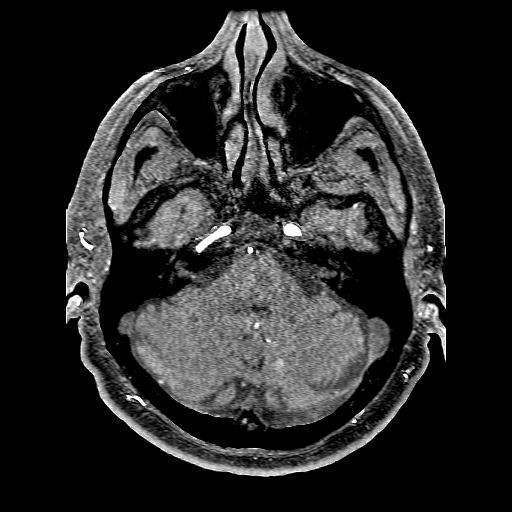
[im 39/184]
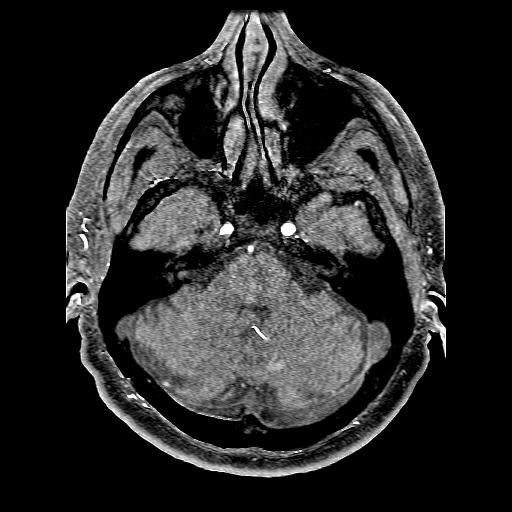
[im 59/184]
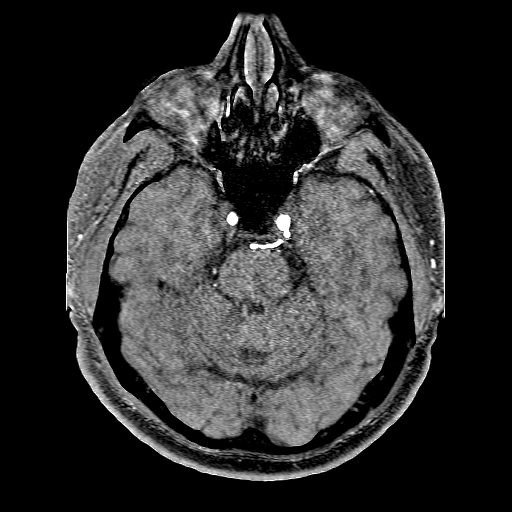
[im 82/184]
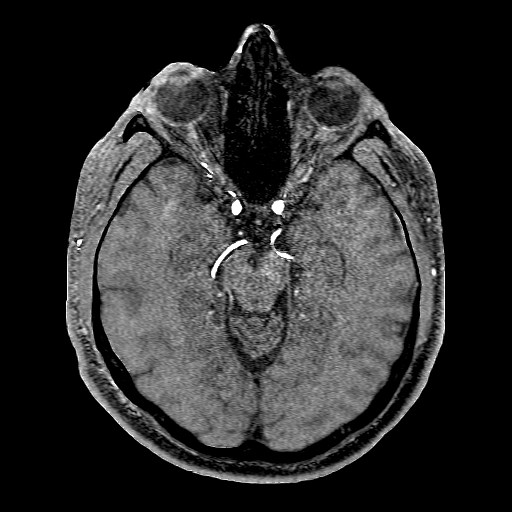
[im 94/184]
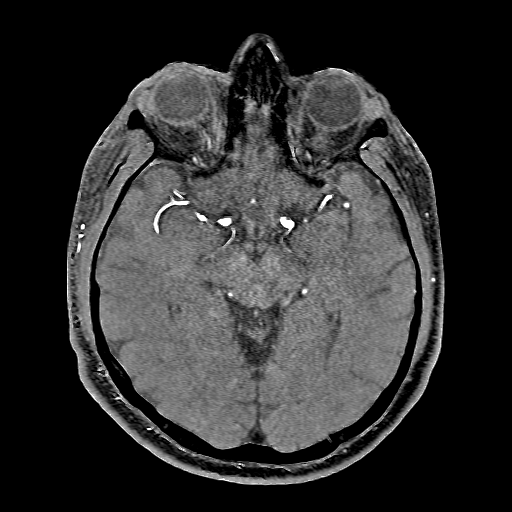
[im 106/184]
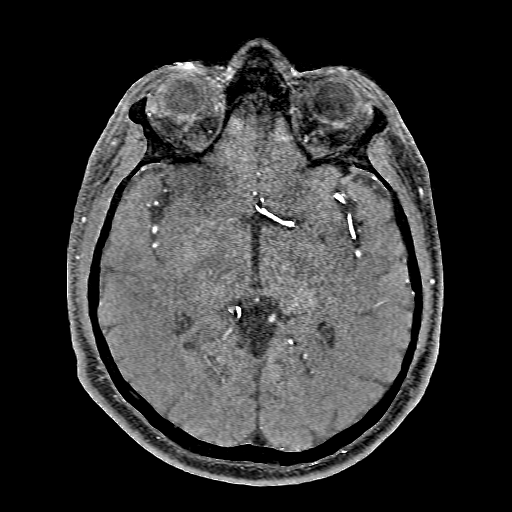
[im 129/184]
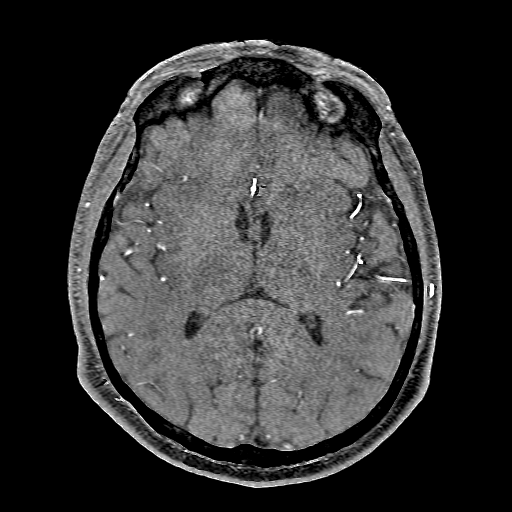
[im 152/184]
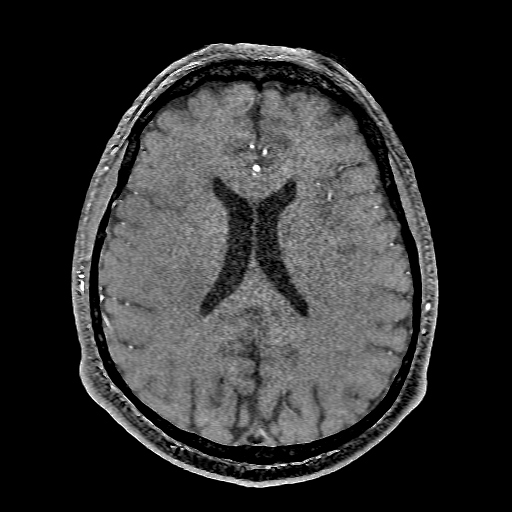
[im 156/184]
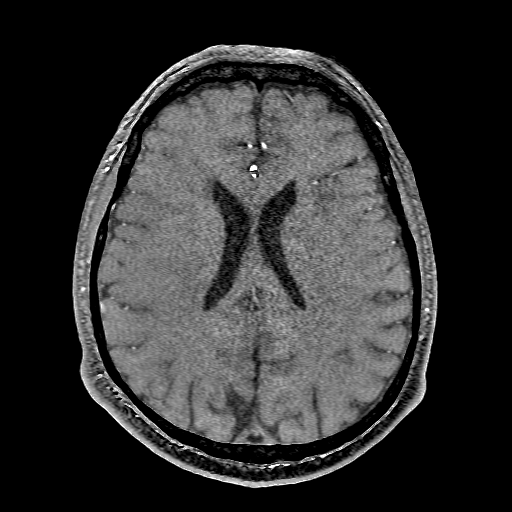
[im 176/184]
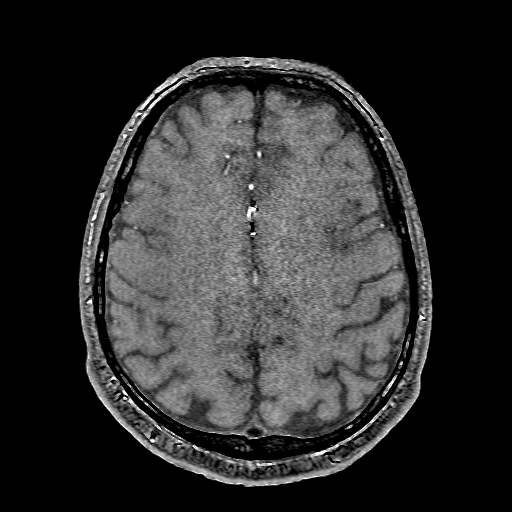

[19 of 48 positions shown; findings below may reference images not displayed]

FINDINGS: Antegrade flow in the posterior circulation.

The vertebral artery V4 segments were better demonstrated on the
postcontrast neck MRA today. The left V4 is mildly dominant. Both
PICA origins are patent. No distal vertebral artery stenosis is
identified.

Patent vertebrobasilar junction. Patent basilar artery. There is a
persistent left side trigeminal artery (unusual normal anatomic
variation series 2, image 55). And there is a fetal left PCA origin.
The SCA and right PCA origins are normal. A right posterior
communicating artery is also present. Bilateral PCA branches are
within normal limits.

Antegrade flow in both ICA siphons.

The left siphon appears mildly dominant. There is no siphon
stenosis. Origin of the persistent left trigeminal artery, bilateral
ophthalmic and bilateral posterior communicating arteries are within
normal limits. Patent carotid termini. Normal MCA and ACA origins.

Anterior communicating artery is within normal limits. The left ACA
A2 is dominant simulating azygos type ACA anatomy. Visible ACA
branches are within normal limits. The left MCA bifurcates early
without stenosis. Visible left MCA branches are within normal
limits. Right MCA M1 segment and bifurcation are patent without
stenosis. Visible right MCA branches are within normal limits.
IMPRESSION: 1. Negative intracranial MRA.
2. Persistent left trigeminal artery, fetal left PCA origin, and
dominant left ACA A2 are normal anatomic variations.

## 2019-12-15 IMAGING — MR MR MRA NECK WO/W CM
4 of 5 series · 19 of 48 positions shown · IV contrast (gadavist)
Comparison: Brain MRI today reported separately.

CLINICAL DATA: 58-year-old male with dizziness, rotational
nystagmus. Symptom onset in [REDACTED].

The patient began pulling out his IV during contrast administration.
EXAM:
MRA NECK WITHOUT AND WITH CONTRAST
TECHNIQUE: Multiplanar and multiecho pulse sequences of the neck were obtained
without and with intravenous contrast. Angiographic images of the
neck were obtained using MRA technique without and with intravenous
contrast.
CONTRAST:  10mL GADAVIST GADOBUTROL 1 MMOL/ML IV SOLN

[Series 600: cor cemra ft · coronal · 1.2mm · 0.59mm/px · 9 of 113 slices shown]
[im 1/113]
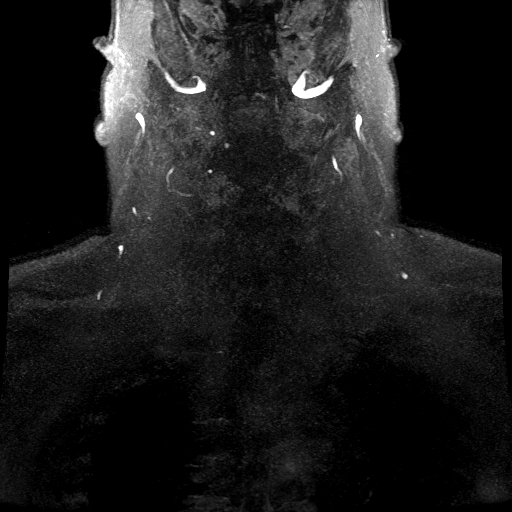
[im 15/113]
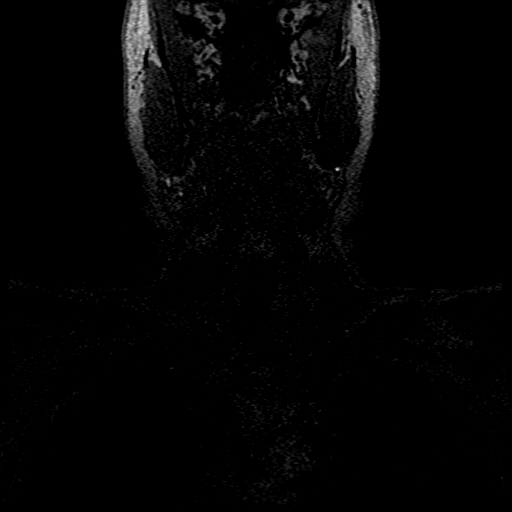
[im 29/113]
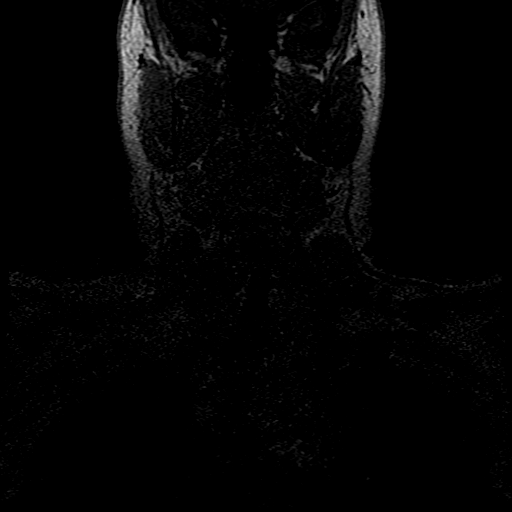
[im 43/113]
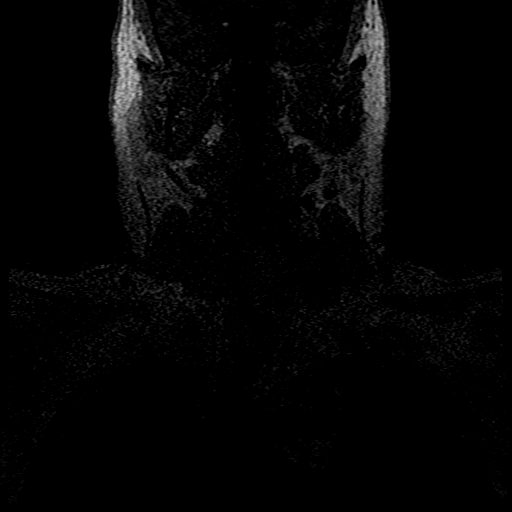
[im 57/113]
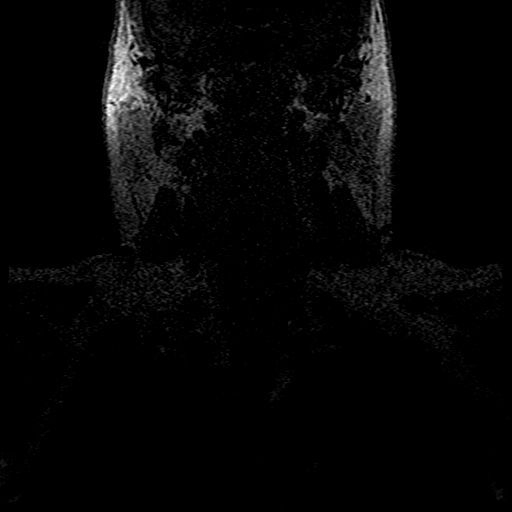
[im 71/113]
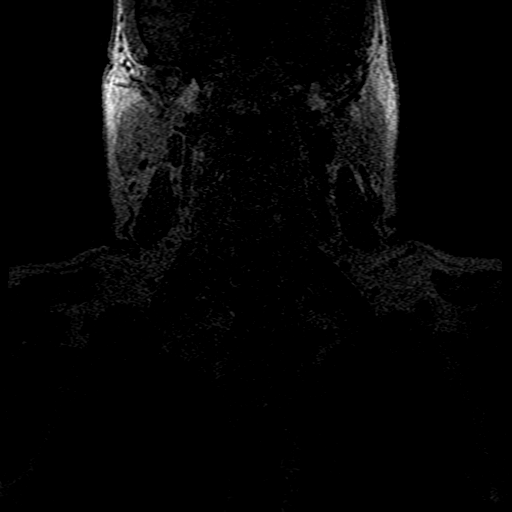
[im 85/113]
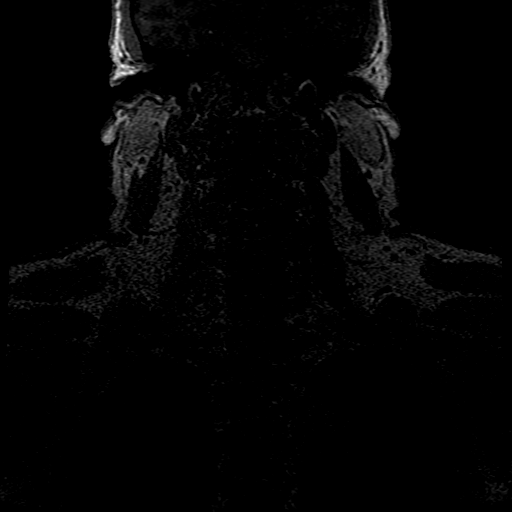
[im 99/113]
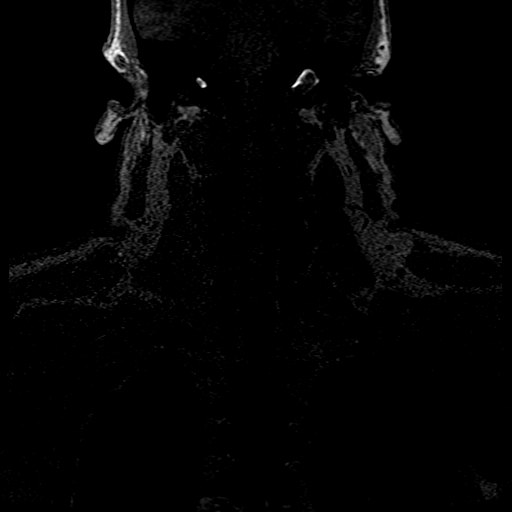
[im 113/113]
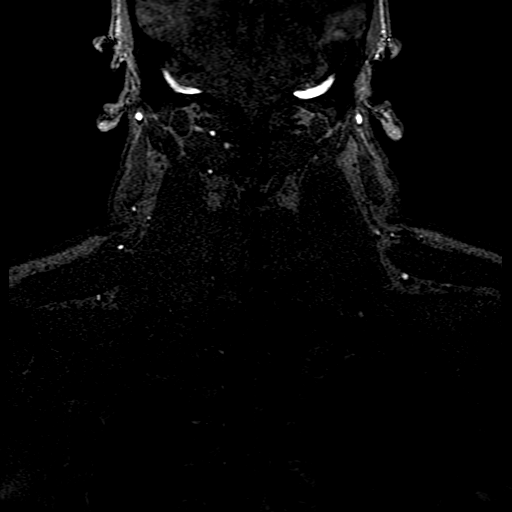

[Series 601: ph1/cor cemra ft · coronal · 1.2mm · 0.59mm/px · 4 of 112 slices shown]
[im 1/112]
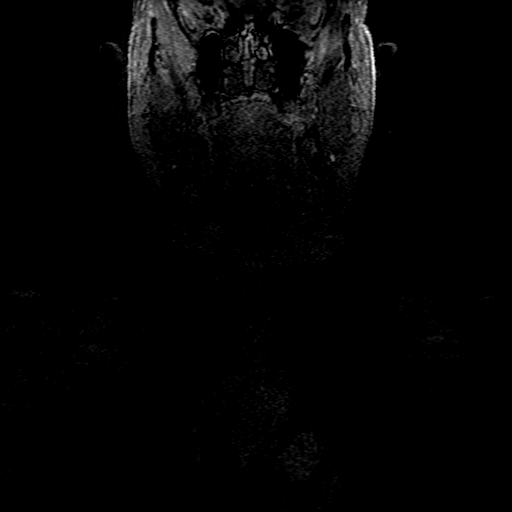
[im 14/112]
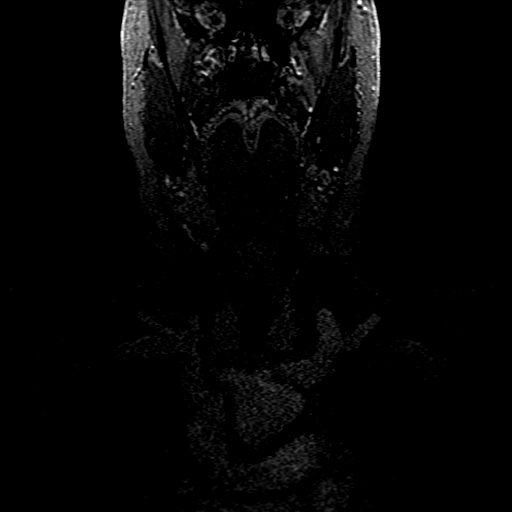
[im 56/112]
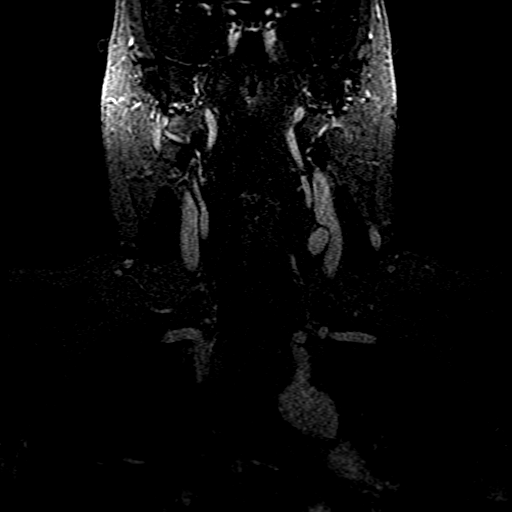
[im 98/112]
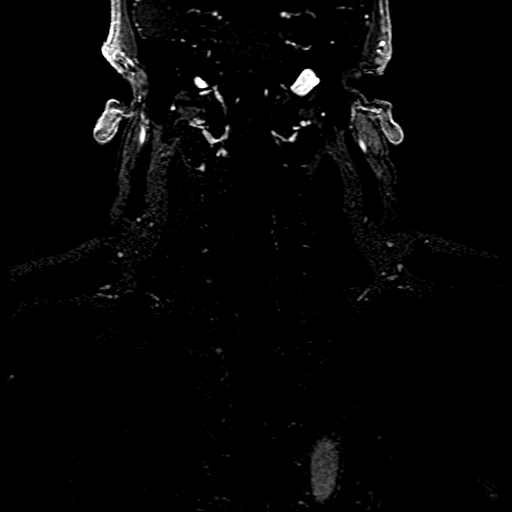

[Series 602: ph2/cor cemra ft · coronal · 1.2mm · 0.59mm/px · 3 of 112 slices shown]
[im 13/112]
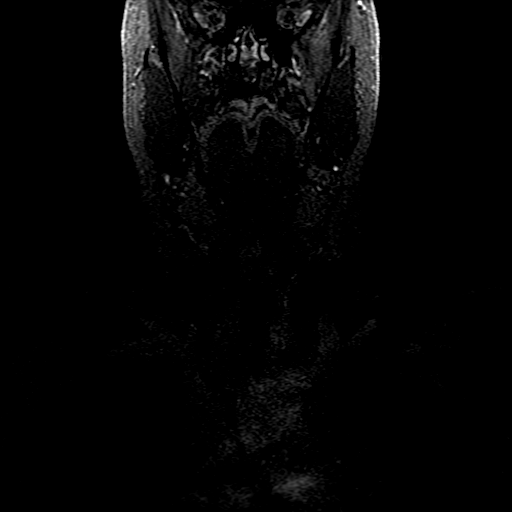
[im 62/112]
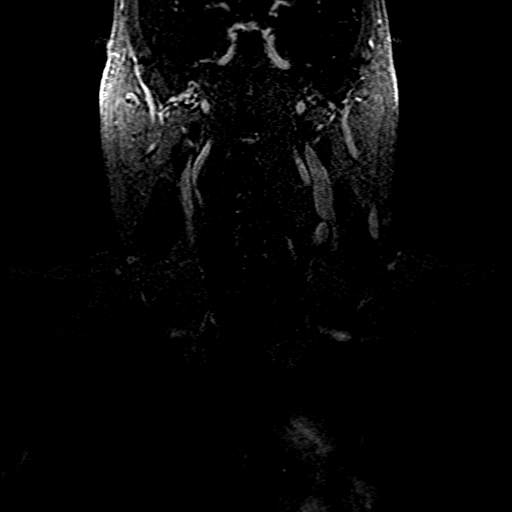
[im 99/112]
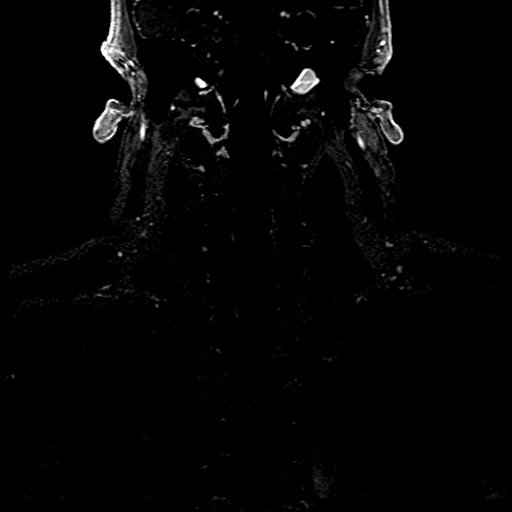

[((date))-((date)) · coronal · 1.2mm · 0.59mm/px · 3 of 113 slices shown]
[im 13/113]
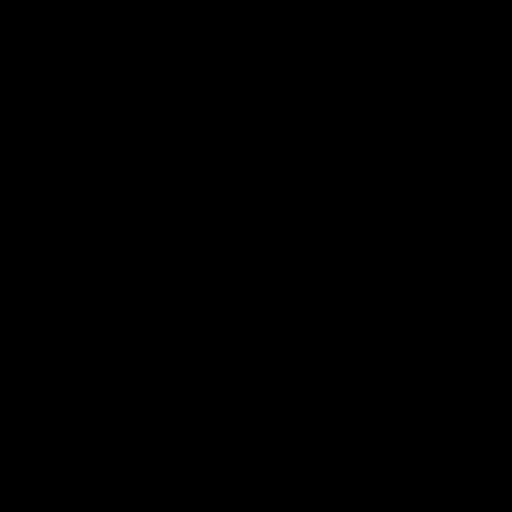
[im 63/113]
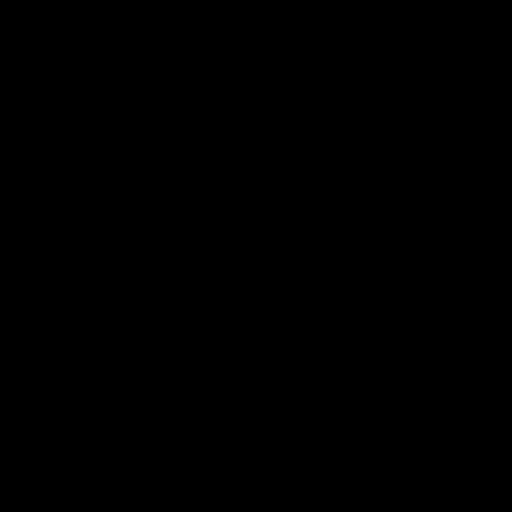
[im 100/113]
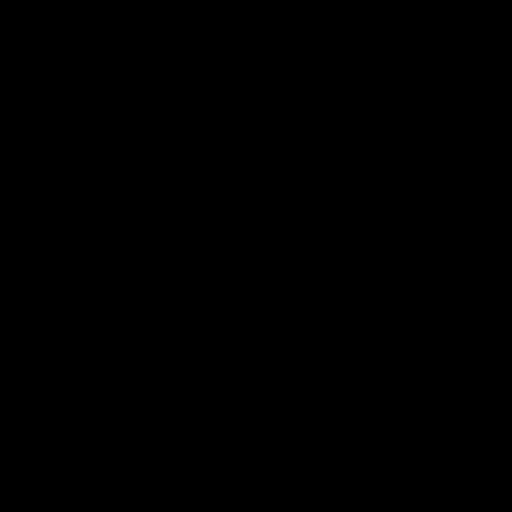

[19 of 48 positions shown; findings below may reference images not displayed]

FINDINGS: Pre contrast time-of-flight images demonstrate antegrade flow in the
bilateral cervical carotid and vertebral arteries. The vertebral
arteries appear codominant. Both carotid bifurcations are patent and
within normal limits. Antegrade flow continues to the skull base.

Post-contrast neck MRA images were obtained but are degraded by
venous contamination due to the patient's activity during contrast
bolus injection (series 601). Three vessel arch configuration with
tortuous proximal great vessels, but no great vessel origin
stenosis.

The right CCA, right carotid bifurcation and cervical right ICA
appear within normal limits.

The left CCA, left carotid bifurcation and cervical left ICA appear
within normal limits.

Tortuous proximal subclavian arteries each with a kinked appearance
at the thoracic inlet. Normal right vertebral artery origin. The
left vertebral origin and V1 segment are tortuous. No definite
cervical vertebral artery stenosis.
IMPRESSION: Negative neck MRA aside from tortuosity of the proximal great
vessels. Suboptimal postcontrast portion of the exam.

## 2019-12-15 IMAGING — MR MR HEAD W/O CM
6 of 11 series · 24 of 48 positions shown · IV contrast (gadavist)
Comparison: None.

CLINICAL DATA: 58-year-old male with dizziness, rotational
nystagmus. Symptom onset in [REDACTED].

A study without and with contrast was planned but the patient
terminated the exam during IV contrast administration and no
postcontrast images were obtained.
Contrast administered: 10 mL Gadavist.
EXAM:
MRI HEAD WITHOUT CONTRAST
TECHNIQUE: Multiplanar, multiecho pulse sequences of the brain and surrounding
structures were obtained without intravenous contrast.

[Series 2: DWI · axial · 3.0mm · 0.94mm/px · z∈[-88,+52]mm · 7 of 100 slices shown (1 of 2)]
[im 1/100]
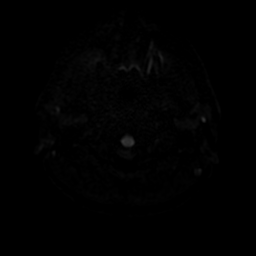
[im 17/100]
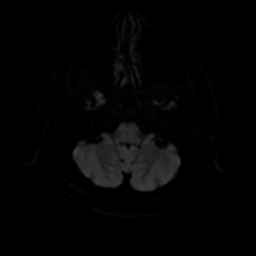
[im 34/100]
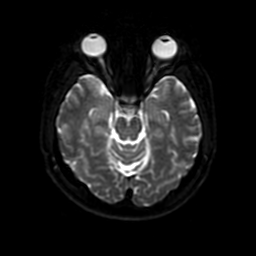
[im 50/100]
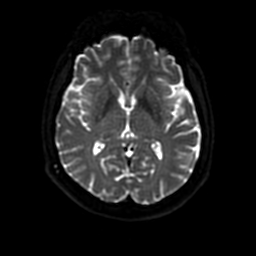
[im 67/100]
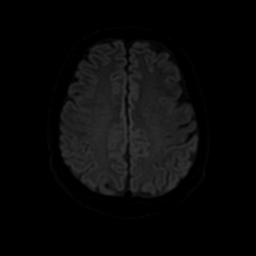
[im 83/100]
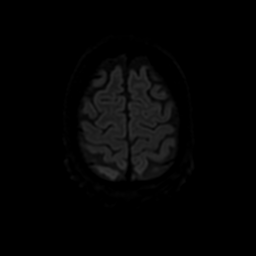
[im 100/100]
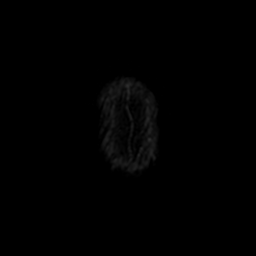

[Series 3: DWI · coronal · 4.0mm · 0.94mm/px · 6 of 74 slices shown (2 of 2)]
[im 1/74]
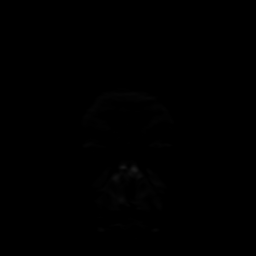
[im 15/74]
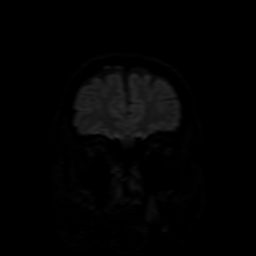
[im 30/74]
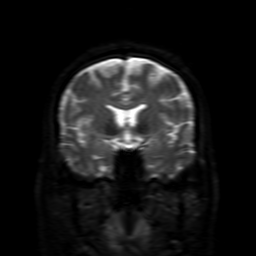
[im 44/74]
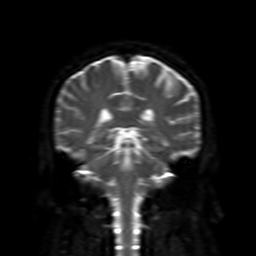
[im 59/74]
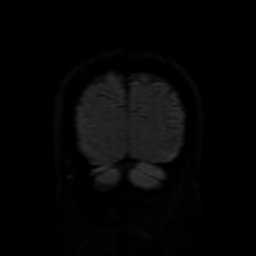
[im 74/74]
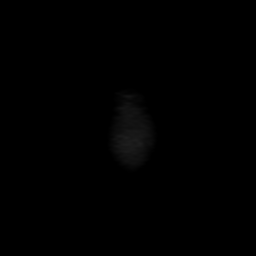

[Series 4: FLAIR · sagittal · 5.0mm · 0.23mm/px · 2 of 28 slices shown (1 of 2)]
[im 1/28]
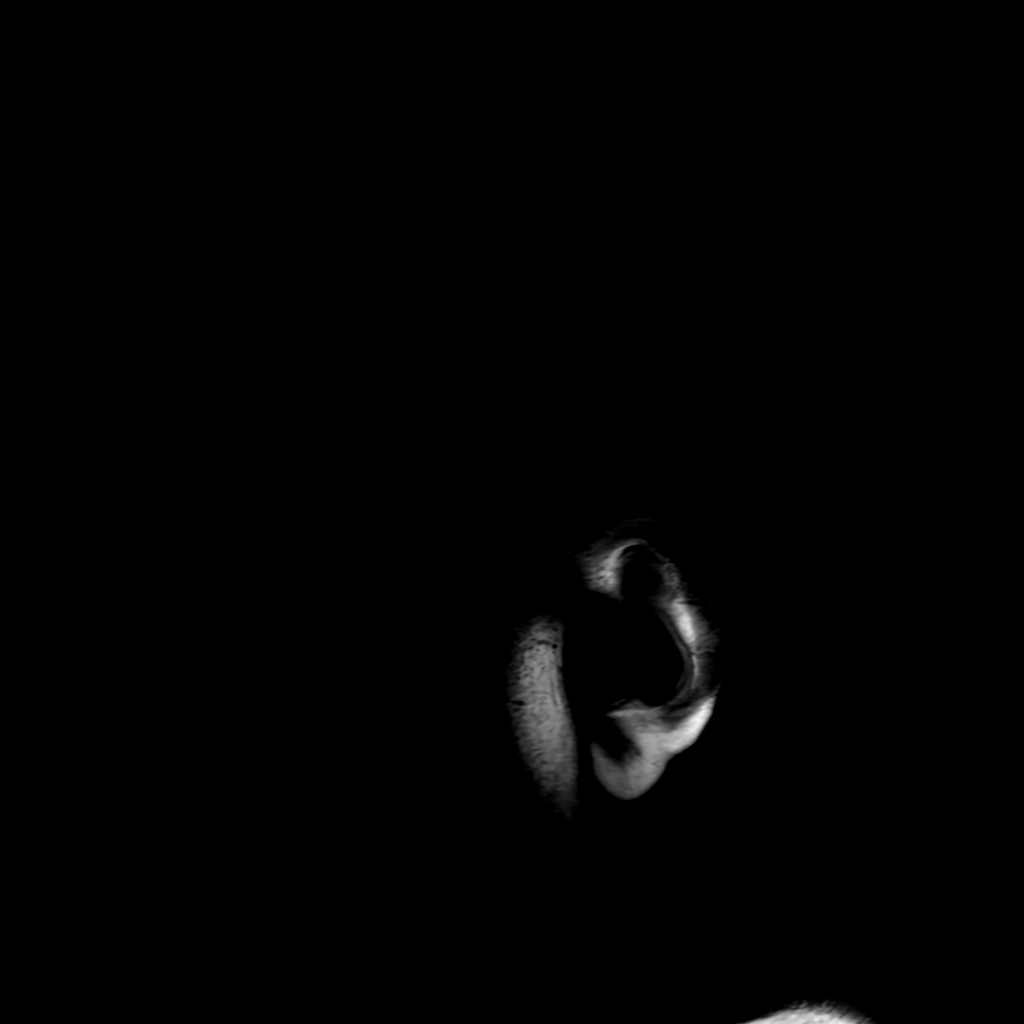
[im 28/28]
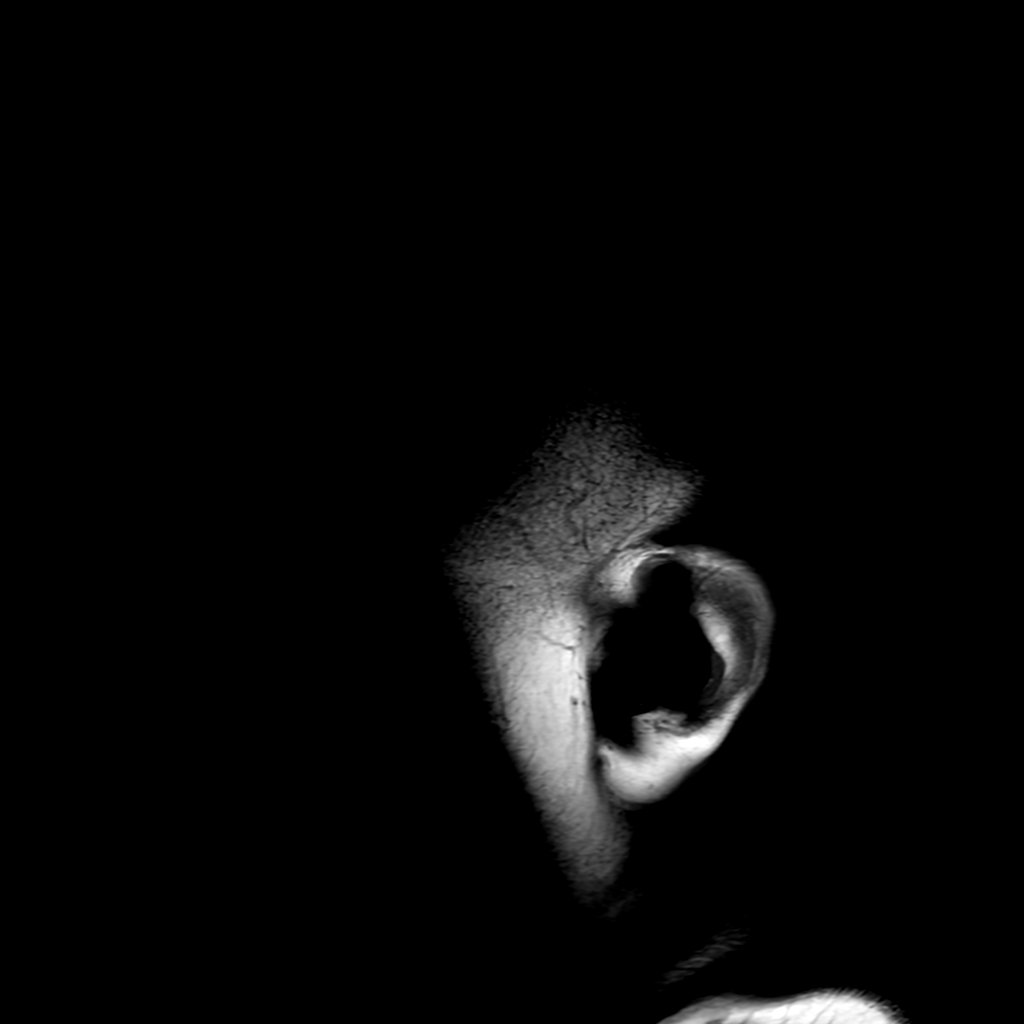

[Series 6: FLAIR · axial · 3.0mm · 0.41mm/px · z∈[-90,+49]mm · 2 of 26 slices shown (2 of 2)]
[im 1/26]
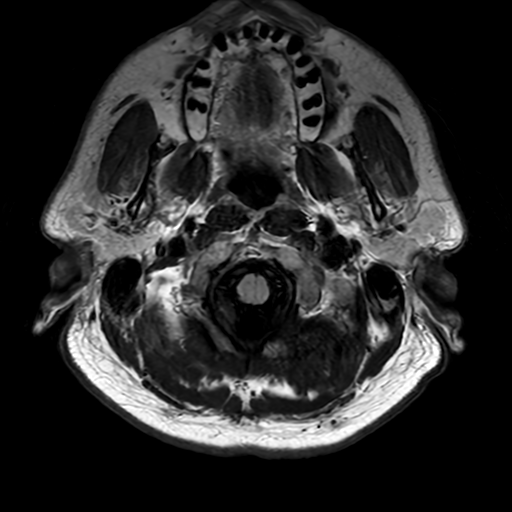
[im 26/26]
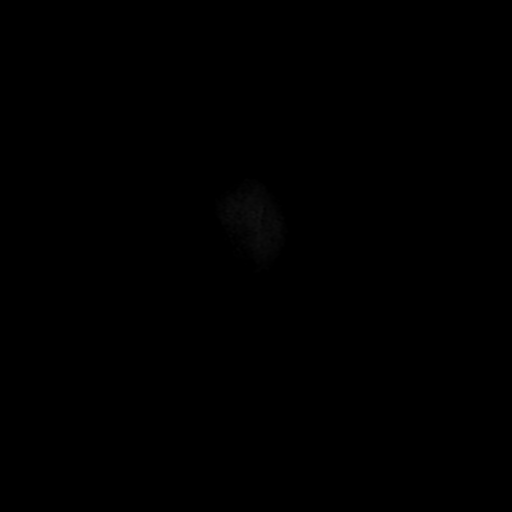

[Series 250: ADC · axial · 3.0mm · 0.94mm/px · z∈[-88,+52]mm · 4 of 50 slices shown (1 of 2)]
[im 1/50]
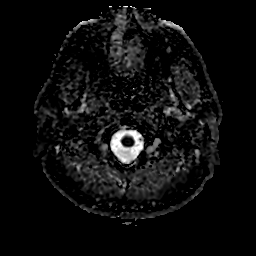
[im 17/50]
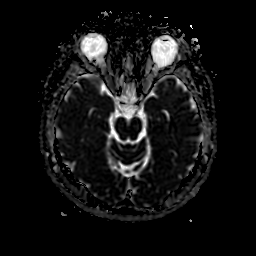
[im 33/50]
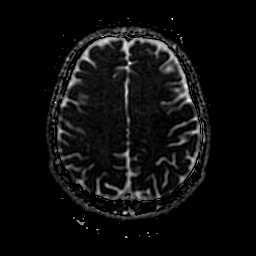
[im 50/50]
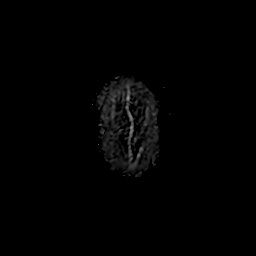

[Series 350: ADC · coronal · 4.0mm · 0.94mm/px · 3 of 37 slices shown (2 of 2)]
[im 1/37]
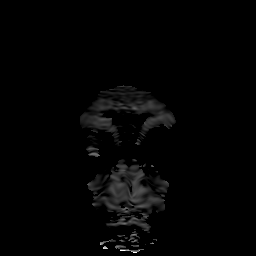
[im 19/37]
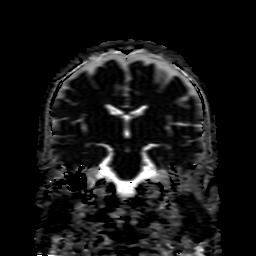
[im 37/37]
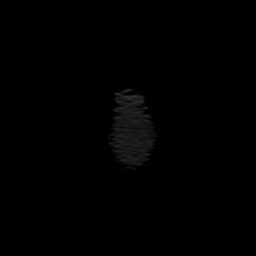

[24 of 48 positions shown; findings below may reference images not displayed]

FINDINGS: Brain: Cerebral volume is within normal limits. No restricted
diffusion to suggest acute infarction. No midline shift, mass
effect, evidence of mass lesion, ventriculomegaly, extra-axial
collection or acute intracranial hemorrhage. Cervicomedullary
junction and pituitary are within normal limits.

Gray and white matter signal is within normal limits for age
throughout the brain. No chronic cerebral blood products or definite
encephalomalacia identified. The deep gray nuclei, brainstem and
cerebellum appear normal.

Vascular: Major intracranial vascular flow voids are preserved. MRA
today is reported separately.

Skull and upper cervical spine: Mild for age visible cervical spine
degeneration. Visualized bone marrow signal is within normal limits.

Sinuses/Orbits: Normal suprasellar cistern and optic chiasm. Orbits
soft tissues appears symmetric and within normal limits. Paranasal
sinuses are clear.

Other: Mastoids are well pneumatized. Visible internal auditory
structures appear unremarkable. Normal stylomastoid foramina. Scalp
and face soft tissues appear negative.
IMPRESSION: No acute intracranial abnormality. Normal for age noncontrast MRI
appearance of the brain.

## 2019-12-15 MED ORDER — GADOBUTROL 1 MMOL/ML IV SOLN
10.0000 mL | Freq: Once | INTRAVENOUS | Status: AC | PRN
  Administered 2019-12-15: 18:00:00 10 mL via INTRAVENOUS

## 2019-12-15 NOTE — Telephone Encounter (Signed)
Called [facility] for peer to peer review.  Spoke with Seth Bake nurse reviewer, information re: Mr. Settlemyre's symptoms provided.  Authorized MRI brain, MRA head and neck.  Authorization [id number].   Please let me know if other info needed to get this ordered today (patient leaves to go out of town tomorrow). Thank you.

## 2019-12-16 ENCOUNTER — Ambulatory Visit: Payer: BLUE CROSS/BLUE SHIELD | Admitting: Family Medicine

## 2019-12-22 NOTE — Telephone Encounter (Signed)
Please Advise on this 

## 2020-01-13 ENCOUNTER — Encounter: Payer: Self-pay | Admitting: Family Medicine

## 2020-01-13 ENCOUNTER — Other Ambulatory Visit: Payer: Self-pay

## 2020-01-13 ENCOUNTER — Ambulatory Visit (INDEPENDENT_AMBULATORY_CARE_PROVIDER_SITE_OTHER): Payer: BLUE CROSS/BLUE SHIELD | Admitting: Family Medicine

## 2020-01-13 VITALS — BP 130/86 | HR 68 | Temp 97.7°F | Ht 65.0 in | Wt 212.0 lb

## 2020-01-13 DIAGNOSIS — E785 Hyperlipidemia, unspecified: Secondary | ICD-10-CM

## 2020-01-13 DIAGNOSIS — Z1283 Encounter for screening for malignant neoplasm of skin: Secondary | ICD-10-CM | POA: Diagnosis not present

## 2020-01-13 DIAGNOSIS — Z125 Encounter for screening for malignant neoplasm of prostate: Secondary | ICD-10-CM

## 2020-01-13 DIAGNOSIS — Z6835 Body mass index (BMI) 35.0-35.9, adult: Secondary | ICD-10-CM

## 2020-01-13 DIAGNOSIS — Z0001 Encounter for general adult medical examination with abnormal findings: Secondary | ICD-10-CM

## 2020-01-13 DIAGNOSIS — R238 Other skin changes: Secondary | ICD-10-CM | POA: Diagnosis not present

## 2020-01-13 DIAGNOSIS — Z1211 Encounter for screening for malignant neoplasm of colon: Secondary | ICD-10-CM

## 2020-01-13 DIAGNOSIS — Z0000 Encounter for general adult medical examination without abnormal findings: Secondary | ICD-10-CM

## 2020-01-13 NOTE — Patient Instructions (Addendum)
I recommend shingrix if you did not have that in 2017 - please check with your pharmacy.  Continue to watch diet, but increasing exercise to most days per week - goal of 117min per week.  I will refer you to dermatology as well as gastroenterology.  Depending on cholesterol reading we can discuss medications if needed or continued approach at diet/exercise.  Thank you for coming in today.   Keeping you healthy  Get these tests  Blood pressure- Have your blood pressure checked once a year by your healthcare provider.  Normal blood pressure is 120/80  Weight- Have your body mass index (BMI) calculated to screen for obesity.  BMI is a measure of body fat based on height and weight. You can also calculate your own BMI at [email or web address].[email or web address].  Cholesterol- Have your cholesterol checked every year.  Diabetes- Have your blood sugar checked regularly if you have high blood pressure, high cholesterol, have a family history of diabetes or if you are overweight.  Screening for Colon Cancer- Colonoscopy starting at age 46.  Screening may begin sooner depending on your family history and other health conditions. Follow up colonoscopy as directed by your Gastroenterologist.  Screening for Prostate Cancer- Both blood work (PSA) and a rectal exam help screen for Prostate Cancer.  Screening begins at age 54 with African-American men and at age 91 with Caucasian men.  Screening may begin sooner depending on your family history.  Take these medicines  Aspirin- One aspirin daily can help prevent Heart disease and Stroke.  Flu shot- Every fall.  Tetanus- Every 10 years.  Zostavax- Once after the age of 2 to prevent Shingles.  Pneumonia shot- Once after the age of 51; if you are younger than 16, ask your healthcare provider if you need a Pneumonia shot.  Take these steps  Don't smoke- If you do smoke, talk to your doctor about quitting.  For tips on how to quit, go to www.smokefree.gov or call  1-800-QUIT-NOW.  Be physically active- Exercise 5 days a week for at least 30 minutes.  If you are not already physically active start slow and gradually work up to 30 minutes of moderate physical activity.  Examples of moderate activity include walking briskly, mowing the yard, dancing, swimming, bicycling, etc.  Eat a healthy diet- Eat a variety of healthy food such as fruits, vegetables, low fat milk, low fat cheese, yogurt, lean meant, poultry, fish, beans, tofu, etc. For more information go to www.thenutritionsource.org  Drink alcohol in moderation- Limit alcohol intake to less than two drinks a day. Never drink and drive.  Dentist- Brush and floss twice daily; visit your dentist twice a year.  Depression- Your emotional health is as important as your physical health. If you're feeling down, or losing interest in things you would normally enjoy please talk to your healthcare provider.  Eye exam- Visit your eye doctor every year.  Safe sex- If you may be exposed to a sexually transmitted infection, use a condom.  Seat belts- Seat belts can save your life; always wear one.  Smoke/Carbon Monoxide detectors- These detectors need to be installed on the appropriate level of your home.  Replace batteries at least once a year.  Skin cancer- When out in the sun, cover up and use sunscreen 15 SPF or higher.  Violence- If anyone is threatening you, please tell your healthcare provider.  Living Will/ Health care power of attorney- Speak with your healthcare provider and family.   If you have lab work  done today you will be contacted with your lab results within the next 2 weeks.  If you have not heard from [place] then please contact us. The fastest way to get your results is to register for My Chart.   IF you received an x-ray today, you will receive an invoice from [facility]. Please contact [facility] at [phone] with questions or concerns regarding your invoice.   IF  you received labwork today, you will receive an invoice from [facility]. Please contact [facility] at [phone] with questions or concerns regarding your invoice.   Our billing staff will not be able to assist you with questions regarding bills from these companies.  You will be contacted with the lab results as soon as they are available. The fastest way to get your results is to activate your My Chart account. Instructions are located on the last page of this paperwork. If you have not heard from [place] regarding the results in 2 weeks, please contact this office.

## 2020-01-13 NOTE — Progress Notes (Signed)
Subjective:  Patient ID: Bryan Hunt, male    DOB: 25-Mar-1961  Age: 59 y.o. MRN: [id number]  CC:  Chief Complaint  Patient presents with  . Annual Exam    pt states he feels well with no complants. pt states alergie medication is working well with no side effects. pt hasn't had any issues with BP since lastt visit.    HPI Bryan Hunt presents for   Annual physical exam.  Most recently evaluated in February with rotational nystagmus, dizziness, treated by ear nose and throat.  Borderline blood pressure previously.  Ultimately had MRI brain as well as MRI angio head and neck without concerning findings. Prior symptoms have resolved. No dizziness. Plan for further eval from ENT if recurs.   Blood pressure at home under 130/70's. No meds.   Hyperlipidemia: No meds - last checked few years ago.  Lab Results  Component Value Date   CHOL 220 (H) 01/29/2017   HDL 49 01/29/2017   LDLCALC 145 (H) 01/29/2017   TRIG 132 01/29/2017   CHOLHDL 4.5 01/29/2017   Lab Results  Component Value Date   ALT 30 01/29/2017   AST 21 01/29/2017   ALKPHOS 76 01/29/2017   BILITOT 0.4 01/29/2017    Cancer screening: Colonoscopy in 2015. Planned repeat 3 years for polyps - tubular adenomas.  Prostate CA: brother had surgery - not cancer known. Agrees to screening after risk/benefit discussion with PSA and DRE.  Derm - none. No personal hx of skin CA. No new moles. Prior skin tags.   Immunization History  Administered Date(s) Administered  . Influenza,inj,Quad PF,6+ Mos 09/20/2018  . Influenza-Unspecified 08/19/2013, 11/15/2014, 09/08/2016  . Tdap 01/29/2017  . Zoster 09/08/2016  unsure if prior shingrix. Had at [facility] aid?  Considering covid vaccine - may be required for international travel.   Depression screen [facility] 2/9 01/13/2020 12/14/2019 12/11/2019 09/09/2019 01/29/2017  Decreased Interest 0 0 0 0 0  Down, Depressed, Hopeless 0 0 0 0 0  PHQ - 2 Score 0 0 0 0 0   No exam data  present Wears glasses - saw optho last year.   Dentist: every 6 months.   Obesity/exercise:  Some walking - 2-3 days per week. Pans on more with improved weather. No sweet tea/soda.  restaurant food - about 50%. Tries to pick healthier choices.  Plan to get back down to 190.   Wt Readings from Last 3 Encounters:  01/13/20 212 lb (96.2 kg)  12/14/19 208 lb (94.3 kg)  12/11/19 208 lb (94.3 kg)   Body mass index is 35.28 kg/m.    History There are no problems to display for this patient.  Past Medical History:  Diagnosis Date  . Allergy   . GERD (gastroesophageal reflux disease)    Past Surgical History:  Procedure Laterality Date  . CHOLECYSTECTOMY    . VASECTOMY     Allergies  Allergen Reactions  . Dust Mite Extract Anaphylaxis    allergy   Prior to Admission medications   Medication Sig Start Date End Date Taking? Authorizing Provider  loratadine (CLARITIN) 10 MG tablet Take 10 mg by mouth daily.   Yes [provider]   Social History   Socioeconomic History  . Marital status: Married    Spouse name: Not on file  . Number of children: Not on file  . Years of education: Not on file  . Highest education level: Not on file  Occupational History  . Not on file  Tobacco Use  .  Smoking status: Never Smoker  . Smokeless tobacco: Never Used  Substance and Sexual Activity  . Alcohol use: Yes    Alcohol/week: 10.0 standard drinks    Types: 10 Glasses of wine per week    Comment: 5-10 drinks beer/wine per week per patient  . Drug use: No  . Sexual activity: Yes  Other Topics Concern  . Not on file  Social History Narrative   Married. Education: [employer].   Social Determinants of Health   Financial Resource Strain:   . Difficulty of Paying Living Expenses:   Food Insecurity:   . Worried About Charity fundraiser in the Last Year:   . Arboriculturist in the Last Year:   Transportation Needs:   . Film/video editor (Medical):   [name] Lack of  Transportation (Non-Medical):   Physical Activity:   . Days of Exercise per Week:   . Minutes of Exercise per Session:   Stress:   . Feeling of Stress :   Social Connections:   . Frequency of Communication with Friends and Family:   . Frequency of Social Gatherings with Friends and Family:   . Attends Religious Services:   . Active Member of Clubs or Organizations:   . Attends Archivist Meetings:   [name] Marital Status:   Intimate Partner Violence:   . Fear of Current or Ex-Partner:   . Emotionally Abused:   [name] Physically Abused:   . Sexually Abused:     Review of Systems 13 point review of systems per patient health survey noted.  Negative other than as indicated above or in HPI.    Objective:   Vitals:   01/13/20 1004  BP: 130/86  Pulse: 68  Temp: 97.7 F (36.5 C)  TempSrc: Temporal  SpO2: 97%  Weight: 212 lb (96.2 kg)  Height: 5\' 5"  (1.651 m)     Physical Exam Vitals reviewed.  Constitutional:      Appearance: He is well-developed.  HENT:     Head: Normocephalic and atraumatic.     Right Ear: External ear normal.     Left Ear: External ear normal.  Eyes:     Conjunctiva/sclera: Conjunctivae normal.     Pupils: Pupils are equal, round, and reactive to light.  Neck:     Thyroid: No thyromegaly.  Cardiovascular:     Rate and Rhythm: Normal rate and regular rhythm.     Heart sounds: Normal heart sounds.  Pulmonary:     Effort: Pulmonary effort is normal. No respiratory distress.     Breath sounds: Normal breath sounds. No wheezing.  Abdominal:     General: There is no distension.     Palpations: Abdomen is soft.     Tenderness: There is no abdominal tenderness.     Hernia: There is no hernia in the left inguinal area or right inguinal area.  Genitourinary:    Prostate: Normal.  Musculoskeletal:        General: No tenderness. Normal range of motion.     Cervical back: Normal range of motion and neck supple.  Lymphadenopathy:     Cervical: No  cervical adenopathy.  Skin:    General: Skin is warm and dry.     Comments: Few dry patches on scalp with a few slightly scaled areas.  Dorsum of scalp.  Neurological:     Mental Status: He is alert and oriented to person, place, and time.     Deep Tendon Reflexes: Reflexes are normal  and symmetric.  Psychiatric:        Behavior: Behavior normal.        Assessment & Plan:  Xavious Sellards is a 59 y.o. male . Annual physical exam  - -anticipatory guidance as below in AVS, screening labs above. Health maintenance items as above in HPI discussed/recommended as applicable.   Special screening for malignant neoplasms, colon - Plan: Ambulatory referral to Gastroenterology  Dry scalp - Plan: Ambulatory referral to Dermatology Screening for skin cancer - Plan: Ambulatory referral to Dermatology  -Few dry, scaly areas as above.  Refer to dermatology  Hyperlipidemia, unspecified hyperlipidemia type - Plan: Lipid panel, Comprehensive metabolic panel  -Check lipids, then determine ASCVD risk score and need for statins  Screening for malignant neoplasm of prostate - Plan: PSA  - We discussed pros and cons of prostate cancer screening, and after this discussion, he chose to have screening done. PSA obtained, and no concerning findings on DRE.   BMI 35.0-35.9,adult  -Increase activity/exercise, continue to monitor diet.  No orders of the defined types were placed in this encounter.  Patient Instructions       If you have lab work done today you will be contacted with your lab results within the next 2 weeks.  If you have not heard from [place] then please contact us. The fastest way to get your results is to register for My Chart.   IF you received an x-ray today, you will receive an invoice from [facility]. Please contact [facility] at [phone] with questions or concerns regarding your invoice.   IF you received labwork today, you will receive an invoice from  [place]. Please contact [facility] at [phone] with questions or concerns regarding your invoice.   Our billing staff will not be able to assist you with questions regarding bills from these companies.  You will be contacted with the lab results as soon as they are available. The fastest way to get your results is to activate your My Chart account. Instructions are located on the last page of this paperwork. If you have not heard from [place] regarding the results in 2 weeks, please contact this office.         Signed, Merri Ray, MD [facility]

## 2020-01-14 ENCOUNTER — Encounter: Payer: Self-pay | Admitting: Internal Medicine

## 2020-01-14 LAB — COMPREHENSIVE METABOLIC PANEL
ALT: 23 IU/L (ref 0–44)
AST: 24 IU/L (ref 0–40)
Albumin/Globulin Ratio: 1.8 (ref 1.2–2.2)
Albumin: 4.4 g/dL (ref 3.8–4.9)
Alkaline Phosphatase: 73 IU/L (ref 39–117)
BUN/Creatinine Ratio: 12 (ref 9–20)
BUN: 11 mg/dL (ref 6–24)
Bilirubin Total: 0.5 mg/dL (ref 0.0–1.2)
CO2: 25 mmol/L (ref 20–29)
Calcium: 9.4 mg/dL (ref 8.7–10.2)
Chloride: 102 mmol/L (ref 96–106)
Creatinine, Ser: 0.89 mg/dL (ref 0.76–1.27)
GFR calc Af Amer: 108 mL/min/{1.73_m2} (ref >59)
GFR calc non Af Amer: 94 mL/min/{1.73_m2} (ref >59)
Globulin, Total: 2.4 g/dL (ref 1.5–4.5)
Glucose: 69 mg/dL (ref 65–99)
Potassium: 4.7 mmol/L (ref 3.5–5.2)
Sodium: 141 mmol/L (ref 134–144)
Total Protein: 6.8 g/dL (ref 6.0–8.5)

## 2020-01-14 LAB — PSA: Prostate Specific Ag, Serum: 1.6 ng/mL (ref 0.0–4.0)

## 2020-01-14 LAB — LIPID PANEL
Chol/HDL Ratio: 3.7 ratio (ref 0.0–5.0)
Cholesterol, Total: 208 mg/dL — ABNORMAL HIGH (ref 100–199)
HDL: 56 mg/dL
LDL Chol Calc (NIH): 138 mg/dL — ABNORMAL HIGH (ref 0–99)
Triglycerides: 80 mg/dL (ref 0–149)
VLDL Cholesterol Cal: 14 mg/dL (ref 5–40)

## 2020-01-19 DIAGNOSIS — L570 Actinic keratosis: Secondary | ICD-10-CM | POA: Diagnosis not present

## 2020-01-19 DIAGNOSIS — L819 Disorder of pigmentation, unspecified: Secondary | ICD-10-CM | POA: Diagnosis not present

## 2020-02-25 DIAGNOSIS — L570 Actinic keratosis: Secondary | ICD-10-CM | POA: Diagnosis not present

## 2020-02-25 DIAGNOSIS — L819 Disorder of pigmentation, unspecified: Secondary | ICD-10-CM | POA: Diagnosis not present

## 2020-03-07 ENCOUNTER — Encounter: Payer: BC Managed Care – PPO | Admitting: Internal Medicine

## 2020-03-21 ENCOUNTER — Other Ambulatory Visit: Payer: Self-pay

## 2020-03-21 ENCOUNTER — Other Ambulatory Visit: Payer: BC Managed Care – PPO

## 2020-03-21 ENCOUNTER — Ambulatory Visit (AMBULATORY_SURGERY_CENTER): Payer: Self-pay | Admitting: *Deleted

## 2020-03-21 VITALS — Ht 64.5 in | Wt 211.0 lb

## 2020-03-21 DIAGNOSIS — Z86010 Personal history of colonic polyps: Secondary | ICD-10-CM

## 2020-03-21 MED ORDER — SUTAB 1479-225-188 MG PO TABS: 1.0000 | ORAL_TABLET | ORAL | 0 refills | Status: DC

## 2020-03-21 NOTE — Progress Notes (Signed)
Patient denies any allergies to egg or soy products. Patient denies complications with anesthesia/sedation.  Patient denies oxygen use at home and denies diet medications. Emmi instructions for colonoscopy explained and given to patient along with [employer] coupon.

## 2020-03-31 ENCOUNTER — Other Ambulatory Visit: Payer: Self-pay | Admitting: Internal Medicine

## 2020-03-31 ENCOUNTER — Ambulatory Visit (INDEPENDENT_AMBULATORY_CARE_PROVIDER_SITE_OTHER): Payer: BC Managed Care – PPO

## 2020-03-31 DIAGNOSIS — Z1159 Encounter for screening for other viral diseases: Secondary | ICD-10-CM | POA: Diagnosis not present

## 2020-03-31 LAB — SARS CORONAVIRUS 2 (TAT 6-24 HRS): SARS Coronavirus 2: NEGATIVE

## 2020-04-04 ENCOUNTER — Ambulatory Visit (AMBULATORY_SURGERY_CENTER): Payer: BC Managed Care – PPO | Admitting: Internal Medicine

## 2020-04-04 ENCOUNTER — Other Ambulatory Visit: Payer: Self-pay

## 2020-04-04 ENCOUNTER — Encounter: Payer: Self-pay | Admitting: Internal Medicine

## 2020-04-04 VITALS — BP 127/89 | HR 58 | Temp 96.9°F | Resp 12 | Ht 64.5 in | Wt 211.0 lb

## 2020-04-04 DIAGNOSIS — D122 Benign neoplasm of ascending colon: Secondary | ICD-10-CM | POA: Diagnosis not present

## 2020-04-04 DIAGNOSIS — D124 Benign neoplasm of descending colon: Secondary | ICD-10-CM | POA: Diagnosis not present

## 2020-04-04 DIAGNOSIS — D128 Benign neoplasm of rectum: Secondary | ICD-10-CM

## 2020-04-04 DIAGNOSIS — D123 Benign neoplasm of transverse colon: Secondary | ICD-10-CM

## 2020-04-04 DIAGNOSIS — Z86010 Personal history of colonic polyps: Secondary | ICD-10-CM | POA: Diagnosis not present

## 2020-04-04 DIAGNOSIS — K621 Rectal polyp: Secondary | ICD-10-CM

## 2020-04-04 DIAGNOSIS — Z1211 Encounter for screening for malignant neoplasm of colon: Secondary | ICD-10-CM | POA: Diagnosis not present

## 2020-04-04 MED ORDER — SODIUM CHLORIDE 0.9 % IV SOLN: 500.0000 mL | Freq: Once | INTRAVENOUS | Status: AC

## 2020-04-04 NOTE — Progress Notes (Signed)
pt tolerated well. VSS. awake and to recovery. Report given to RN.  

## 2020-04-04 NOTE — Progress Notes (Signed)
Called to room to assist during endoscopic procedure.  Patient ID and intended procedure confirmed with present staff. Received instructions for my participation in the procedure from the performing physician.  

## 2020-04-04 NOTE — Op Note (Signed)
[facility] Patient Name: Bryan Hunt Procedure Date: 04/04/2020 9:45 AM MRN: [id number] Endoscopist: Jerene Bears , MD Age: 59 Referring MD:  Date of Birth: 08-23-1961 Gender: Male Account #: [id number] Procedure:                Colonoscopy Indications:              High risk colon cancer surveillance: Personal                            history of multiple (3 or more) adenomas, Last                            colonoscopy: July 2015 Medicines:                Monitored Anesthesia Care Procedure:                Pre-Anesthesia Assessment:                           - Prior to the procedure, a History and Physical                            was performed, and patient medications and                            allergies were reviewed. The patient's tolerance of                            previous anesthesia was also reviewed. The risks                            and benefits of the procedure and the sedation                            options and risks were discussed with the patient.                            All questions were answered, and informed consent                            was obtained. Prior Anticoagulants: The patient has                            taken no previous anticoagulant or antiplatelet                            agents. ASA Grade Assessment: II - A patient with                            mild systemic disease. After reviewing the risks                            and benefits, the patient was deemed in  satisfactory condition to undergo the procedure.                           After obtaining informed consent, the colonoscope                            was passed under direct vision. Throughout the                            procedure, the patient's blood pressure, pulse, and                            oxygen saturations were monitored continuously. The                            Colonoscope was introduced through the anus and                            advanced to the cecum, identified by appendiceal                            orifice and ileocecal valve. The colonoscopy was                            performed without difficulty. The patient tolerated                            the procedure well. The quality of the bowel                            preparation was good. The ileocecal valve,                            appendiceal orifice, and rectum were photographed. Scope In: 9:55:40 AM Scope Out: 10:10:35 AM Scope Withdrawal Time: 0 hours 12 minutes 31 seconds  Total Procedure Duration: 0 hours 14 minutes 55 seconds  Findings:                 The digital rectal exam was normal.                           A 3 mm polyp was found in the ascending colon. The                            polyp was sessile. The polyp was removed with a                            cold snare. Resection and retrieval were complete.                           A 5 mm polyp was found in the transverse colon. The                            polyp was sessile.  The polyp was removed with a                            cold snare. Resection and retrieval were complete.                           Two sessile polyps were found in the descending                            colon. The polyps were 3 to 4 mm in size. These                            polyps were removed with a cold snare. Resection                            and retrieval were complete.                           A 4 mm polyp was found in the distal rectum. The                            polyp was sessile. The polyp was removed with a                            cold snare. Resection and retrieval were complete.                           Multiple small-mouthed diverticula were found in                            the sigmoid colon.                           Internal hemorrhoids were found during                            retroflexion. The hemorrhoids were small. Complications:            No  immediate complications. Estimated Blood Loss:     Estimated blood loss was minimal. Impression:               - One 3 mm polyp in the ascending colon, removed                            with a cold snare. Resected and retrieved.                           - One 5 mm polyp in the transverse colon, removed                            with a cold snare. Resected and retrieved.                           - Two 3 to 4 mm polyps  in the descending colon,                            removed with a cold snare. Resected and retrieved.                           - One 4 mm polyp in the rectum, removed with a cold                            snare. Resected and retrieved.                           - Diverticulosis in the sigmoid colon.                           - Small internal hemorrhoids. Recommendation:           - Patient has a contact number available for                            emergencies. The signs and symptoms of potential                            delayed complications were discussed with the                            patient. Return to normal activities tomorrow.                            Written discharge instructions were provided to the                            patient.                           - Resume previous diet.                           - Continue present medications.                           - Await pathology results.                           - Repeat colonoscopy is recommended for                            surveillance. The colonoscopy date will be                            determined after pathology results from today's                            exam become available for review. Jerene Bears, MD 04/04/2020 10:14:57 AM This report has been signed electronically.

## 2020-04-04 NOTE — Patient Instructions (Signed)
Handouts Provided:  Polyps, Hemorrhoids, and Diverticulosis  YOU HAD AN ENDOSCOPIC PROCEDURE TODAY AT [place]:   Refer to the procedure report that was given to you for any specific questions about what was found during the examination.  If the procedure report does not answer your questions, please call your gastroenterologist to clarify.  If you requested that your care partner not be given the details of your procedure findings, then the procedure report has been included in a sealed envelope for you to review at your convenience later.  YOU SHOULD EXPECT: Some feelings of bloating in the abdomen. Passage of more gas than usual.  Walking can help get rid of the air that was put into your GI tract during the procedure and reduce the bloating. If you had a lower endoscopy (such as a colonoscopy or flexible sigmoidoscopy) you may notice spotting of blood in your stool or on the toilet paper. If you underwent a bowel prep for your procedure, you may not have a normal bowel movement for a few days.  Please Note:  You might notice some irritation and congestion in your nose or some drainage.  This is from the oxygen used during your procedure.  There is no need for concern and it should clear up in a day or so.  SYMPTOMS TO REPORT IMMEDIATELY:   Following lower endoscopy (colonoscopy or flexible sigmoidoscopy):  Excessive amounts of blood in the stool  Significant tenderness or worsening of abdominal pains  Swelling of the abdomen that is new, acute  Fever of 100F or higher  For urgent or emergent issues, a gastroenterologist can be reached at any hour by calling [phone]. Do not use MyChart messaging for urgent concerns.    DIET:  We do recommend a small meal at first, but then you may proceed to your regular diet.  Drink plenty of fluids but you should avoid alcoholic beverages for 24 hours.  ACTIVITY:  You should plan to take it easy for the rest of today and you  should NOT DRIVE or use heavy machinery until tomorrow (because of the sedation medicines used during the test).    FOLLOW UP: Our staff will call the number listed on your records 48-72 hours following your procedure to check on you and address any questions or concerns that you may have regarding the information given to you following your procedure. If we do not reach you, we will leave a message.  We will attempt to reach you two times.  During this call, we will ask if you have developed any symptoms of COVID 19. If you develop any symptoms (ie: fever, flu-like symptoms, shortness of breath, cough etc.) before then, please call [phone].  If you test positive for Covid 19 in the 2 weeks post procedure, please call and report this information to [place].    If any biopsies were taken you will be contacted by phone or by letter within the next 1-3 weeks.  Please call us at [phone] if you have not heard about the biopsies in 3 weeks.    SIGNATURES/CONFIDENTIALITY: You and/or your care partner have signed paperwork which will be entered into your electronic medical record.  These signatures attest to the fact that that the information above on your After Visit Summary has been reviewed and is understood.  Full responsibility of the confidentiality of this discharge information lies with you and/or your care-partner.

## 2020-04-06 ENCOUNTER — Encounter: Payer: Self-pay | Admitting: Internal Medicine

## 2020-04-06 ENCOUNTER — Telehealth: Payer: Self-pay | Admitting: *Deleted

## 2020-04-06 NOTE — Telephone Encounter (Signed)
  Follow up Call-  Call back number 04/04/2020  Post procedure Call Back phone  # [phone]  Permission to leave phone message Yes  Some recent data might be hidden     Patient questions:  Message left to call if necessary.  Second call.

## 2020-04-06 NOTE — Telephone Encounter (Signed)
  Follow up Call-  Call back number 04/04/2020  Post procedure Call Back phone  # [phone]  Permission to leave phone message Yes  Some recent data might be hidden     Patient questions:  Message left to call us if necessary.

## 2020-05-31 DIAGNOSIS — R05 Cough: Secondary | ICD-10-CM | POA: Diagnosis not present

## 2020-05-31 DIAGNOSIS — R509 Fever, unspecified: Secondary | ICD-10-CM | POA: Diagnosis not present

## 2020-05-31 DIAGNOSIS — U071 COVID-19: Secondary | ICD-10-CM | POA: Diagnosis not present

## 2020-06-06 ENCOUNTER — Ambulatory Visit (HOSPITAL_COMMUNITY)
Admit: 2020-06-06 | Discharge: 2020-06-06 | Disposition: A | Discharge status: Home / Self Care | Payer: BC Managed Care – PPO | Attending: Pulmonary Disease | Admitting: Pulmonary Disease

## 2020-06-06 ENCOUNTER — Emergency Department (HOSPITAL_COMMUNITY)
Admission: EM | Admit: 2020-06-06 | Discharge: 2020-06-06 | Disposition: A | Discharge status: Home / Self Care | Payer: BC Managed Care – PPO | Attending: Emergency Medicine | Admitting: Emergency Medicine

## 2020-06-06 ENCOUNTER — Other Ambulatory Visit: Payer: Self-pay | Admitting: Nurse Practitioner

## 2020-06-06 ENCOUNTER — Encounter (HOSPITAL_COMMUNITY): Payer: Self-pay | Admitting: Obstetrics and Gynecology

## 2020-06-06 ENCOUNTER — Telehealth: Payer: Self-pay | Admitting: Nurse Practitioner

## 2020-06-06 ENCOUNTER — Other Ambulatory Visit: Payer: Self-pay

## 2020-06-06 ENCOUNTER — Emergency Department (HOSPITAL_COMMUNITY): Payer: BC Managed Care – PPO

## 2020-06-06 DIAGNOSIS — U071 COVID-19: Secondary | ICD-10-CM

## 2020-06-06 DIAGNOSIS — R0602 Shortness of breath: Secondary | ICD-10-CM | POA: Diagnosis not present

## 2020-06-06 DIAGNOSIS — R05 Cough: Secondary | ICD-10-CM | POA: Insufficient documentation

## 2020-06-06 DIAGNOSIS — R509 Fever, unspecified: Secondary | ICD-10-CM | POA: Diagnosis not present

## 2020-06-06 DIAGNOSIS — R059 Cough, unspecified: Secondary | ICD-10-CM

## 2020-06-06 DIAGNOSIS — R0600 Dyspnea, unspecified: Secondary | ICD-10-CM | POA: Diagnosis not present

## 2020-06-06 DIAGNOSIS — R918 Other nonspecific abnormal finding of lung field: Secondary | ICD-10-CM | POA: Insufficient documentation

## 2020-06-06 DIAGNOSIS — R9389 Abnormal findings on diagnostic imaging of other specified body structures: Secondary | ICD-10-CM

## 2020-06-06 IMAGING — DX DG CHEST 1V PORT
1 series · 1 of 1 positions shown · non-contrast
Comparison: [DATE]

CLINICAL DATA: Productive cough and shortness of breath

EXAM:
PORTABLE CHEST 1 VIEW

[chest ap]
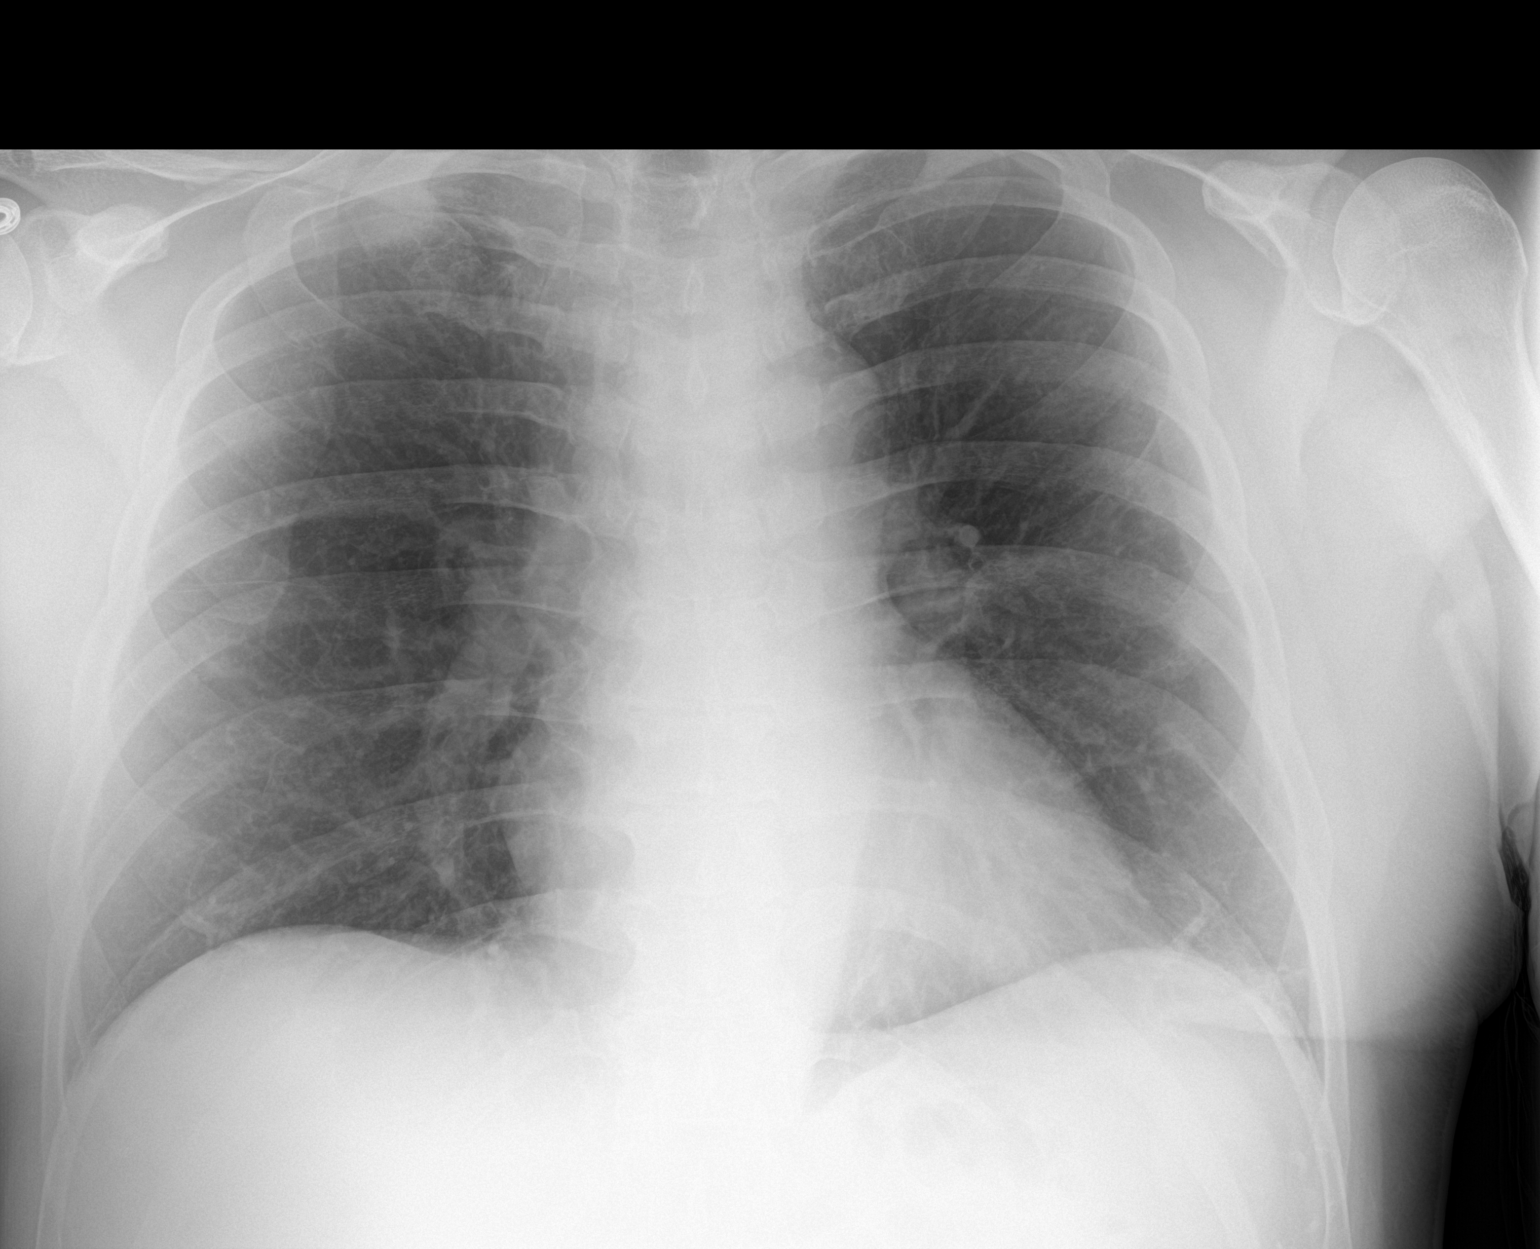

[1 of 1 positions shown; findings below may reference images not displayed]

FINDINGS: Cardiac shadow is within normal limits. The lungs are well aerated
bilaterally. No focal infiltrate or sizable effusion is seen. No
acute bony abnormality is noted. In the right lung apex there is a
pleural based density slightly more prominent than that seen on the
prior exam. No underlying bony destruction is noted although this is
somewhat suspicious for pleural based lesion. Noncontrast CT is
recommended for further evaluation.
IMPRESSION: Likely progressive scarring in the right apex although a pleural
based lesion cannot be totally excluded. Noncontrast CT would be
helpful for further evaluation.

## 2020-06-06 MED ORDER — METHYLPREDNISOLONE SODIUM SUCC 125 MG IJ SOLR: 125.0000 mg | Freq: Once | INTRAMUSCULAR | Status: DC | PRN

## 2020-06-06 MED ORDER — DIPHENHYDRAMINE HCL 50 MG/ML IJ SOLN: 50.0000 mg | Freq: Once | INTRAMUSCULAR | Status: DC | PRN

## 2020-06-06 MED ORDER — SODIUM CHLORIDE 0.9 % IV SOLN
1200.0000 mg | Freq: Once | INTRAVENOUS | Status: DC
  Filled 2020-06-06: qty 10

## 2020-06-06 MED ORDER — FAMOTIDINE IN NACL 20-0.9 MG/50ML-% IV SOLN: 20.0000 mg | Freq: Once | INTRAVENOUS | Status: DC | PRN

## 2020-06-06 MED ORDER — SODIUM CHLORIDE 0.9 % IV SOLN: INTRAVENOUS | Status: DC | PRN

## 2020-06-06 MED ORDER — EPINEPHRINE 0.3 MG/0.3ML IJ SOAJ: 0.3000 mg | Freq: Once | INTRAMUSCULAR | Status: DC | PRN

## 2020-06-06 MED ORDER — SODIUM CHLORIDE 0.9 % IV SOLN
1200.0000 mg | Freq: Once | INTRAVENOUS | Status: AC
  Administered 2020-06-06: 1200 mg via INTRAVENOUS
  Filled 2020-06-06: qty 10

## 2020-06-06 MED ORDER — ALBUTEROL SULFATE HFA 108 (90 BASE) MCG/ACT IN AERS: 2.0000 | INHALATION_SPRAY | Freq: Once | RESPIRATORY_TRACT | Status: DC | PRN

## 2020-06-06 NOTE — Progress Notes (Signed)
I connected by phone with Bryan Hunt on 06/06/2020 at 11:12 AM to discuss the potential use of a new treatment for mild to moderate COVID-19 viral infection in non-hospitalized patients.  This patient is a 58 y.o. male that meets the FDA criteria for Emergency Use Authorization of COVID monoclonal antibody casirivimab/imdevimab.  Has a (+) direct SARS-CoV-2 viral test result  Has mild or moderate COVID-19   Is NOT hospitalized due to COVID-19  Is within 10 days of symptom onset  Has at least one of the high risk factor(s) for progression to severe COVID-19 and/or hospitalization as defined in EUA.  Specific high risk criteria : BMI > 25   I have spoken and communicated the following to the patient or parent/caregiver regarding COVID monoclonal antibody treatment:  1. FDA has authorized the emergency use for the treatment of mild to moderate COVID-19 in adults and pediatric patients with positive results of direct SARS-CoV-2 viral testing who are 32 years of age and older weighing at least 40 kg, and who are at high risk for progressing to severe COVID-19 and/or hospitalization.  2. The significant known and potential risks and benefits of COVID monoclonal antibody, and the extent to which such potential risks and benefits are unknown.  3. Information on available alternative treatments and the risks and benefits of those alternatives, including clinical trials.  4. Patients treated with COVID monoclonal antibody should continue to self-isolate and use infection control measures (e.g., wear mask, isolate, social distance, avoid sharing personal items, clean and disinfect "high touch" surfaces, and frequent handwashing) according to CDC guidelines.   5. The patient or parent/caregiver has the option to accept or refuse COVID monoclonal antibody treatment.  After reviewing this information with the patient, The patient agreed to proceed with receiving casirivimab\imdevimab infusion and will  be provided a copy of the Fact sheet prior to receiving the infusion. Bevan Vu Pickenpack-Cousar 06/06/2020 11:12 AM

## 2020-06-06 NOTE — ED Provider Notes (Signed)
[facility] DEPT Provider Note   CSN: [id number] Arrival date & time: 06/06/20  2774     History Chief Complaint  Patient presents with   COVID positive    Bryan Hunt is a 59 y.o. male.  HPI    59 yo male ho covid diagnosed at [facility] 8/3, sxs began 7/31.  Patient state fever chills better but cough worse.  Patient with some dyspnea. He was treated with steroids and antibiotics recently.  Patient reports some relief with tylenol.  He was advised by a family friend to come to ED for remdesivir tx.  Fever last night to 100. Taking po ok, with some diarrhea, no vomiting.  Past Medical History:  Diagnosis Date   Allergy    GERD (gastroesophageal reflux disease)     There are no problems to display for this patient.   Past Surgical History:  Procedure Laterality Date   CHOLECYSTECTOMY     COLONOSCOPY  2015   polyps5 TAs   VASECTOMY     WISDOM TOOTH EXTRACTION         Family History  Problem Relation Age of Onset   Lung cancer Father    Colon cancer Neg Hx    Rectal cancer Neg Hx    Stomach cancer Neg Hx     Social History   Tobacco Use   Smoking status: Never Smoker   Smokeless tobacco: Never Used  Vaping Use   Vaping Use: Never used  Substance Use Topics   Alcohol use: Yes    Alcohol/week: 14.0 standard drinks    Types: 14 Glasses of wine per week    Comment: 14 drinks beer/wine per week per patient   Drug use: No    Home Medications Prior to Admission medications   Medication Sig Start Date End Date Taking? Authorizing Provider  loratadine (CLARITIN) 10 MG tablet Take 10 mg by mouth daily.    [provider]  TOLAK 4 % CREA SMARTSIG:1 Sparingly Topical Daily 01/19/20   [provider]    Allergies    Dust mite extract  Review of Systems   Review of Systems  Physical Exam Updated Vital Signs BP 133/86 (BP Location: Right Arm)    Pulse 71    Temp 98 F (36.7 C)  (Oral)    Resp 18    Ht 1.651 m (5\' 5" )    Wt 90.7 kg    SpO2 96%    BMI 33.28 kg/m   Physical Exam Vitals and nursing note reviewed.  Constitutional:      Appearance: He is well-developed.  HENT:     Head: Normocephalic and atraumatic.     Right Ear: External ear normal.     Left Ear: External ear normal.     Nose: Nose normal.  Eyes:     Conjunctiva/sclera: Conjunctivae normal.     Pupils: Pupils are equal, round, and reactive to light.  Cardiovascular:     Rate and Rhythm: Normal rate and regular rhythm.     Heart sounds: Normal heart sounds.  Pulmonary:     Effort: Pulmonary effort is normal.     Breath sounds: Normal breath sounds.  Abdominal:     General: Bowel sounds are normal.     Palpations: Abdomen is soft.  Musculoskeletal:        General: Normal range of motion.     Cervical back: Normal range of motion and neck supple.  Skin:    General: Skin is  warm and dry.  Neurological:     Mental Status: He is alert and oriented to person, place, and time.     Deep Tendon Reflexes: Reflexes are normal and symmetric.  Psychiatric:        Behavior: Behavior normal.        Thought Content: Thought content normal.        Judgment: Judgment normal.     ED Results / Procedures / Treatments   Labs (all labs ordered are listed, but only abnormal results are displayed) Labs Reviewed - No data to display  EKG None  Radiology DG Chest [facility] 1 View  Result Date: 06/06/2020 CLINICAL DATA:  Productive cough and shortness of breath EXAM: PORTABLE CHEST 1 VIEW COMPARISON:  03/04/2019 FINDINGS: Cardiac shadow is within normal limits. The lungs are well aerated bilaterally. No focal infiltrate or sizable effusion is seen. No acute bony abnormality is noted. In the right lung apex there is a pleural based density slightly more prominent than that seen on the prior exam. No underlying bony destruction is noted although this is somewhat suspicious for pleural based lesion. Noncontrast CT  is recommended for further evaluation. IMPRESSION: Likely progressive scarring in the right apex although a pleural based lesion cannot be totally excluded. Noncontrast CT would be helpful for further evaluation. Electronically Signed   By: Inez Catalina M.D.   On: 06/06/2020 09:52    Procedures Procedures (including critical care time)  Medications Ordered in ED Medications - No data to display  ED Course  I have reviewed the triage vital signs and the nursing notes.  Pertinent labs & imaging results that were available during my care of the patient were reviewed by me and considered in my medical decision making (see chart for details).    MDM Rules/Calculators/A&P                         59 year old male with Covid presents today Patient on day 10 of covid symptoms Patient qualifies for mab therapy based on bmi Discussed with pharmacy and mab clinic Patient has appointment for 230 today for inusion CXR abnormality noted and patient advised re follow up as op Bryan Hunt was evaluated in Emergency Department on 06/06/2020 for the symptoms described in the history of present illness. He was evaluated in the context of the global COVID-19 pandemic, which necessitated consideration that the patient might be at risk for infection with the SARS-CoV-2 virus that causes COVID-19. Institutional protocols and algorithms that pertain to the evaluation of patients at risk for COVID-19 are in a state of rapid change based on information released by regulatory bodies including the CDC and federal and state organizations. These policies and algorithms were followed during the patient's care in the ED.   Final Clinical Impression(s) / ED Diagnoses Final diagnoses:  COVID-19  Abnormal chest CT    Rx / DC Orders ED Discharge Orders    None       Pattricia Boss, MD 06/06/20 1055

## 2020-06-06 NOTE — Discharge Instructions (Signed)

## 2020-06-06 NOTE — ED Triage Notes (Signed)
Patient reports he has been positive for COVID for 10 days and wants to get remdesivir. Patient in no active distress at this time

## 2020-06-06 NOTE — Telephone Encounter (Signed)
Called to discuss with Bryan Hunt about Covid symptoms and the use of casirivimab/imdevimab, a combination monoclonal antibody infusion for those with mild to moderate Covid symptoms and at a high risk of hospitalization.     Pt is qualified for this infusion at the [facility] infusion center due to co-morbid conditions(BMI. 25).  Symptom onset 05/28/20.   He verbalized understanding of infusion and appointment details. Scheduled for 8/9//21 at 2:30 pm.   Alda Lea, AGPCNP-BC

## 2020-06-06 NOTE — Discharge Instructions (Addendum)
Please present to the mab therapy infusion center today at 215 for 230 appointment.  Make follow up appointment with covid clinic Return if you are worsening especially increased shortness of breath  Hello Bryan Hunt,   You have been scheduled to receive Regeneron (the monoclonal antibody we discussed) on : Today 06/06/20 @ 2:30 pm with an arrival time of 2:15 pm.     The address for the infusion clinic site is:  --GPS address is [place] - the parking is located near [employer] building where you will see  COVID19 Infusion feather banner marking the entrance to parking.   (see photos below)            --Enter into the 2nd entrance where the "wave, flag banner" is at the road. Turn into this 2nd entrance and immediately turn left to park in 1 of the 5 parking spots. Please stay in your car and call the desk for assistance inside [phone].   --Average time in department is roughly 3 hours for Regeneron treatment - this includes preparation of the medication, IV start and the required 1 hour monitoring after the infusion.    Should you develop worsening shortness of breath, chest pain or severe breathing problems please do not wait for this appointment and go to the Emergency room for evaluation and treatment.   The day of your visit you should: [name] Get plenty of rest the night before and drink plenty of water . Eat a light meal/snack before coming and take your medications as prescribed  . Wear warm, comfortable clothes with a shirt that can roll-up over the elbow (will need IV start).  . Wear a mask  . Consider bringing some activity to help pass the time  We are starting to see some insurances bill for the administration of the medication - we are learning more information but you may receive a bill after your appointment. It has ranged from $300-640. We can get you in touch with Customer Service team for billing to help if you incur a bill.   I hope this helps find  you feeling better,  Elder Love, NP

## 2020-06-06 NOTE — Progress Notes (Signed)
  Diagnosis: COVID-19  Physician: Dr. Asencion Noble  Procedure: Covid Infusion Clinic Med: casirivimab\imdevimab infusion - Provided patient with casirivimab\imdevimab fact sheet for patients, parents and caregivers prior to infusion.  Complications: No immediate complications noted.  Discharge: Discharged home   Acquanetta Chain 06/06/2020

## 2020-06-13 ENCOUNTER — Ambulatory Visit (INDEPENDENT_AMBULATORY_CARE_PROVIDER_SITE_OTHER): Payer: BC Managed Care – PPO | Admitting: Nurse Practitioner

## 2020-06-13 ENCOUNTER — Other Ambulatory Visit: Payer: Self-pay

## 2020-06-13 VITALS — BP 144/82 | HR 80 | Temp 97.3°F | Ht 65.0 in | Wt 206.0 lb

## 2020-06-13 DIAGNOSIS — Z8616 Personal history of COVID-19: Secondary | ICD-10-CM | POA: Diagnosis not present

## 2020-06-13 NOTE — Assessment & Plan Note (Signed)
Glad you are better!  Stay active  Stay well hydrated  May take vitamin C, Vitamin, D, and zinc   Follow up:  Follow up as needed

## 2020-06-13 NOTE — Progress Notes (Signed)
@Patient  ID: Bryan Hunt, male    DOB: May 14, 1961, 59 y.o.   MRN: [id number]  Chief Complaint  Patient presents with  . Establish Care    Tested positive for COVID 8/3. No concerns stated he is feeling better with no sx.     Referring provider: Wendie Agreste, MD   59 year old male with no significant health history. Diagnosed with Covid August 2021.   HPI  Patient presents today for post Covid care clinic visit.  Patient states that his symptom onset was 05/28/2020.  He did receive monoclonal antibody infusion treatment on 06/06/2020.  He states that he is now better.  He denies any lingering symptoms.  He decided to be assessed today to make sure that his lungs did sound clear.  Patient does have a slight cough noted in office today.  Patient states that this is baseline for him due to seasonal allergies and postnasal drip.  Patient is active and does take multivitamins. Denies f/c/s, n/v/d, hemoptysis, PND, chest pain or edema.       Allergies  Allergen Reactions  . Dust Mite Extract Anaphylaxis    allergy    Immunization History  Administered Date(s) Administered  . Influenza,inj,Quad PF,6+ Mos 09/20/2018  . Influenza-Unspecified 08/19/2013, 11/15/2014, 09/08/2016  . Tdap 01/29/2017  . Zoster 09/08/2016    Past Medical History:  Diagnosis Date  . Allergy   . GERD (gastroesophageal reflux disease)     Tobacco History: Social History   Tobacco Use  Smoking Status Never Smoker  Smokeless Tobacco Never Used   Counseling given: Yes   Outpatient Encounter Medications as of 06/13/2020  Medication Sig  . acetaminophen (TYLENOL) 325 MG tablet Take 650 mg by mouth every 6 (six) hours as needed for mild pain or headache.  . Ascorbic Acid (VITAMIN C PO) Take 1 tablet by mouth daily.  . Multiple Vitamins-Minerals (ZINC PO) Take 1 tablet by mouth daily.  [name] VITAMIN D PO Take 1 tablet by mouth daily.  . [DISCONTINUED] azithromycin (ZITHROMAX) 250 MG tablet Take 250-500  mg by mouth as directed. 500mg  on day 1 250mg  on day 2-5  . [DISCONTINUED] predniSONE (STERAPRED UNI-PAK 21 TAB) 5 MG (21) TBPK tablet Take 10 mg by mouth as directed. 6 day dose pack   Facility-Administered Encounter Medications as of 06/13/2020  Medication  . 0.9 %  sodium chloride infusion     Review of Systems  Review of Systems  Constitutional: Negative.  Negative for chills and fever.  HENT: Negative.   Respiratory: Positive for cough. Negative for shortness of breath and wheezing.   Cardiovascular: Negative.  Negative for chest pain, palpitations and leg swelling.  Gastrointestinal: Negative.   Allergic/Immunologic: Negative.   Neurological: Negative.   Psychiatric/Behavioral: Negative.        Physical Exam  BP (!) 144/82 (BP Location: Left Arm, Patient Position: Sitting, Cuff Size: Small)   Pulse 80   Temp (!) 97.3 F (36.3 C)   Ht 5\' 5"  (1.651 m)   Wt 206 lb (93.4 kg)   SpO2 99%   BMI 34.28 kg/m   Wt Readings from Last 5 Encounters:  06/13/20 206 lb (93.4 kg)  06/06/20 200 lb (90.7 kg)  04/04/20 211 lb (95.7 kg)  03/21/20 211 lb (95.7 kg)  01/13/20 212 lb (96.2 kg)     Physical Exam Vitals and nursing note reviewed.  Constitutional:      General: He is not in acute distress.    Appearance: He is  well-developed.  Cardiovascular:     Rate and Rhythm: Normal rate and regular rhythm.  Pulmonary:     Effort: Pulmonary effort is normal.     Breath sounds: Normal breath sounds.  Musculoskeletal:     Right lower leg: No edema.     Left lower leg: No edema.  Skin:    General: Skin is warm and dry.  Neurological:     Mental Status: He is alert and oriented to person, place, and time.       Imaging: DG Chest Port 1 View  Result Date: 06/06/2020 CLINICAL DATA:  Productive cough and shortness of breath EXAM: PORTABLE CHEST 1 VIEW COMPARISON:  03/04/2019 FINDINGS: Cardiac shadow is within normal limits. The lungs are well aerated bilaterally. No focal  infiltrate or sizable effusion is seen. No acute bony abnormality is noted. In the right lung apex there is a pleural based density slightly more prominent than that seen on the prior exam. No underlying bony destruction is noted although this is somewhat suspicious for pleural based lesion. Noncontrast CT is recommended for further evaluation. IMPRESSION: Likely progressive scarring in the right apex although a pleural based lesion cannot be totally excluded. Noncontrast CT would be helpful for further evaluation. Electronically Signed   By: Inez Catalina M.D.   On: 06/06/2020 09:52     Assessment & Plan:   History of COVID-19 Glad you are better!  Stay active  Stay well hydrated  May take vitamin C, Vitamin, D, and zinc   Follow up:  Follow up as needed      Fenton Foy, NP 06/13/2020

## 2020-06-13 NOTE — Patient Instructions (Signed)
Covid 19:  Glad you are better!  Stay active  Stay well hydrated  May take vitamin C, Vitamin, D, and zinc   Follow up:  Follow up as needed

## 2020-07-27 NOTE — Progress Notes (Signed)
Subjective:  Patient ID: Bryan Hunt, male    DOB: February 01, 1961  Age: 59 y.o. MRN: [id number]  CC:      Chief Complaint  Patient presents with  . Follow-up    on hypertension. pt states this past weekend was good. pt states he was really relaxed this weekend he didn't check his BP, but feels it must be better and deffently not worse than it has been. pt has had no physical symptoms of hypertension.   HPI: Bryan Hunt is a 59 y.o. male  Elevated blood pressure readings at home, see last visit, stable in office, possible false readings from wrist monitor.    BP Readings from Last 3 Encounters:  12/14/19 128/86  12/11/19 130/84  09/09/19 128/83   Vertigo: See last visit, ENT eval with thought to have peripheral symptoms, but noted to have rotational nystagmus last visit.  Has not followed up with ENT after recent testing last week.  Low-dose meclizine as option from Friday's visit.  Rotational nystagmus noted on supine exam.  Slight drifting to the left on 1 testing with standing but no focal weakness appreciated.  Ability without difficulty otherwise. Has tried meclizine low dose - 3 times per day.  More self aware - more intentional with mvmts, but feels good. Not dizzy.  No new weakness.  Prior head pressure resolved. No HA.  No current ASA.      History There are no problems to display for this patient.      Past Medical History:  Diagnosis Date  . Allergy   . GERD (gastroesophageal reflux disease)         Past Surgical History:  Procedure Laterality Date  . CHOLECYSTECTOMY    . VASECTOMY          Allergies  Allergen Reactions  . Dust Mite Extract Anaphylaxis    allergy          Prior to Admission medications   Medication Sig Start Date End Date Taking? Authorizing Provider  loratadine (CLARITIN) 10 MG tablet Take 10 mg by mouth daily.    [provider]  meclizine (ANTIVERT) 12.5 MG tablet Take 1 tablet (12.5 mg total) by  mouth 3 (three) times daily as needed for dizziness. 12/11/19   Wendie Agreste, MD   Social History        Socioeconomic History  . Marital status: Married    Spouse name: Not on file  . Number of children: Not on file  . Years of education: Not on file  . Highest education level: Not on file  Occupational History  . Not on file  Tobacco Use  . Smoking status: Never Smoker  . Smokeless tobacco: Never Used  Substance and Sexual Activity  . Alcohol use: Yes    Alcohol/week: 10.0 standard drinks    Types: 10 Glasses of wine per week    Comment: 5-10 drinks beer/wine per week per patient  . Drug use: No  . Sexual activity: Yes  Other Topics Concern  . Not on file  Social History Narrative   Married. Education: [employer].   Social Determinants of Health   Financial Resource Strain:   . Difficulty of Paying Living Expenses: Not on file  Food Insecurity:   . Worried About Charity fundraiser in the Last Year: Not on file  . Ran Out of Food in the Last Year: Not on file  Transportation Needs:   . Lack of Transportation (Medical): Not on file  .  Lack of Transportation (Non-Medical): Not on file  Physical Activity:   . Days of Exercise per Week: Not on file  . Minutes of Exercise per Session: Not on file  Stress:   . Feeling of Stress : Not on file  Social Connections:   . Frequency of Communication with Friends and Family: Not on file  . Frequency of Social Gatherings with Friends and Family: Not on file  . Attends Religious Services: Not on file  . Active Member of Clubs or Organizations: Not on file  . Attends Archivist Meetings: Not on file  . Marital Status: Not on file  Intimate Partner Violence:   . Fear of Current or Ex-Partner: Not on file  . Emotionally Abused: Not on file  . Physically Abused: Not on file  . Sexually Abused: Not on file    Review of Systems  Per HPI.  Objective:       Vitals:   12/14/19 0856 12/14/19 0858   BP: (!) 137/91 128/86  Pulse: 71   Temp: (!) 97.4 F (36.3 C)   TempSrc: Temporal   SpO2: 98%   Weight: 208 lb (94.3 kg)   Height: 5\' 5"  (1.651 m)      Physical Exam Vitals reviewed.  Constitutional:      Appearance: He is well-developed.  HENT:     Head: Normocephalic and atraumatic.  Eyes:     General: Lids are normal.     Extraocular Movements:     Right eye: Nystagmus present.     Left eye: Nystagmus present.     Conjunctiva/sclera: Conjunctivae normal.     Pupils: Pupils are equal, round, and reactive to light.     Comments: PERRL.  Fast-nystagmus laterally to the right with right deviation, minimal left left eye deviation.  Increased with supine, primarily horizontal, no appreciable rotational component at this time.  Neck:     Vascular: No carotid bruit or JVD.  Cardiovascular:     Rate and Rhythm: Normal rate and regular rhythm.     Heart sounds: Normal heart sounds. No murmur.  Pulmonary:     Effort: Pulmonary effort is normal.     Breath sounds: Normal breath sounds. No rales.  Skin:    General: Skin is warm and dry.  Neurological:     General: No focal deficit present.     Mental Status: He is alert and oriented to person, place, and time.     Cranial Nerves: Cranial nerves are intact. No cranial nerve deficit, dysarthria or facial asymmetry.     Motor: Motor function is intact. No weakness, tremor, abnormal muscle tone or pronator drift.     Coordination: Coordination is intact. Coordination normal. Finger-Nose-Finger Test normal.     Gait: Gait is intact.        Assessment & Plan:  Bryan Hunt is a 59 y.o. male . Rotational nystagmus - Plan: MR Brain Wo Contrast, MR Angiogram Head Wo Contrast, MR Angiogram Neck W Wo Contrast, DISCONTINUED: meclizine (ANTIVERT) 12.5 MG tablet  Pressure in head - Plan: MR Brain Wo Contrast, MR Angiogram Head Wo Contrast, MR Angiogram Neck W Wo Contrast  Dizziness - Plan: MR Brain Wo Contrast, MR  Angiogram Head Wo Contrast, MR Angiogram Neck W Wo Contrast, DISCONTINUED: meclizine (ANTIVERT) 12.5 MG tablet improved, but with rotational component - MRI/MRA ordered to r/o posterior circulation insult/mass. Continue meclizine, ER precautions.   No orders of the defined types were placed in this encounter.  Patient Instructions  Patient Instructions   Glad to hear the symptoms are improving.  I will check to see if we can get an MRI and MRA scheduled today or tomorrow.  Continue meclizine 3 times daily as needed.  Blood pressure also looks good today.  If any new or worsening symptoms be seen right away.   If you have lab work done today you will be contacted with your lab results within the next 2 weeks.  If you have not heard from [place] then please contact us. The fastest way to get your results is to register for My Chart.   IF you received an x-ray today, you will receive an invoice from [facility]. Please contact [facility] at [phone] with questions or concerns regarding your invoice.   IF you received labwork today, you will receive an invoice from [place]. Please contact [facility] at [phone] with questions or concerns regarding your invoice.   Our billing staff will not be able to assist you with questions regarding bills from these companies.  You will be contacted with the lab results as soon as they are available. The fastest way to get your results is to activate your My Chart account. Instructions are located on the last page of this paperwork. If you have not heard from [place] regarding the results in 2 weeks, please contact this office.         If you have lab work done today you will be contacted with your lab results within the next 2 weeks.  If you have not heard from [place] then please contact us. The fastest way to get your results is to register for My Chart.   IF you received an x-ray today, you will receive an invoice from  [facility]. Please contact [facility] at [phone] with questions or concerns regarding your invoice.   IF you received labwork today, you will receive an invoice from [place]. Please contact [facility] at [phone] with questions or concerns regarding your invoice.   Our billing staff will not be able to assist you with questions regarding bills from these companies.  You will be contacted with the lab results as soon as they are available. The fastest way to get your results is to activate your My Chart account. Instructions are located on the last page of this paperwork. If you have not heard from [place] regarding the results in 2 weeks, please contact this office.         Signed, Merri Ray, MD [facility]

## 2020-08-29 DIAGNOSIS — D229 Melanocytic nevi, unspecified: Secondary | ICD-10-CM | POA: Diagnosis not present

## 2020-08-29 DIAGNOSIS — L570 Actinic keratosis: Secondary | ICD-10-CM | POA: Diagnosis not present

## 2020-08-29 DIAGNOSIS — D1801 Hemangioma of skin and subcutaneous tissue: Secondary | ICD-10-CM | POA: Diagnosis not present

## 2020-08-29 DIAGNOSIS — L814 Other melanin hyperpigmentation: Secondary | ICD-10-CM | POA: Diagnosis not present

## 2020-08-29 DIAGNOSIS — L819 Disorder of pigmentation, unspecified: Secondary | ICD-10-CM | POA: Diagnosis not present

## 2020-10-05 DIAGNOSIS — Z20822 Contact with and (suspected) exposure to covid-19: Secondary | ICD-10-CM | POA: Diagnosis not present

## 2021-01-12 ENCOUNTER — Telehealth (INDEPENDENT_AMBULATORY_CARE_PROVIDER_SITE_OTHER): Payer: BC Managed Care – PPO | Admitting: Family Medicine

## 2021-01-12 ENCOUNTER — Encounter: Payer: Self-pay | Admitting: Family Medicine

## 2021-01-12 ENCOUNTER — Encounter: Payer: BLUE CROSS/BLUE SHIELD | Admitting: Family Medicine

## 2021-01-12 VITALS — Ht 65.0 in | Wt 207.0 lb

## 2021-01-12 DIAGNOSIS — E785 Hyperlipidemia, unspecified: Secondary | ICD-10-CM

## 2021-01-12 DIAGNOSIS — R918 Other nonspecific abnormal finding of lung field: Secondary | ICD-10-CM

## 2021-01-12 DIAGNOSIS — Z0001 Encounter for general adult medical examination with abnormal findings: Secondary | ICD-10-CM | POA: Diagnosis not present

## 2021-01-12 DIAGNOSIS — Z125 Encounter for screening for malignant neoplasm of prostate: Secondary | ICD-10-CM | POA: Diagnosis not present

## 2021-01-12 DIAGNOSIS — Z0000 Encounter for general adult medical examination without abnormal findings: Secondary | ICD-10-CM

## 2021-01-12 DIAGNOSIS — Z8042 Family history of malignant neoplasm of prostate: Secondary | ICD-10-CM

## 2021-01-12 NOTE — Progress Notes (Signed)
Virtual Visit via Video Note  I connected with Bryan Hunt on 01/12/21 at 8:30 AM by a video enabled telemedicine application and verified that I am speaking with the correct person using two identifiers.  Changed to phone after technical issues few mins into visit.  Patient location: work My location: home   I discussed the limitations, risks, security and privacy concerns of performing an evaluation and management service by telephone and the availability of in person appointments. I also discussed with the patient that there may be a patient responsible charge related to this service. The patient expressed understanding and agreed to proceed, consent obtained  Chief complaint:  Chief Complaint  Patient presents with   Annual Exam    Pt reports he feels well with no complaints.     History of Present Illness: Bryan Hunt is a 60 y.o. male  Annual physical exam  COVID-19 infection in August 2021 (symptom onset 05/28/2020) received monoclonal antibody infusion 06/06/2020, azithromycin, prednisone.  Evaluated by [facility] June 13, 2020 with some residual cough.  Chest x-ray showed likely progressive scarring in the right apex although a pleural-based lesion cannot be totally excluded.  Option of noncontrast CT.   Hyperlipidemia: Slight elevation previously but improved total cholesterol last year.  No current meds.  Previous ASCVD risk score 7.8%. No early CAD/heart disease in family. Has been trying to lose weight toward 190 - had been able to lose weight in past. Some weight gain with pandemic - at 205-207 now.  The 10-year ASCVD risk score [name] DC [name]., et al., 2013) is: 10%   Values used to calculate the score:     Age: 21 years     Sex: Male     Is Non-Hispanic African American: No     Diabetic: No     Tobacco smoker: No     Systolic Blood Pressure: 595 mmHg     Is BP treated: No     HDL Cholesterol: 56 mg/dL     Total Cholesterol: 208 mg/dL  Lab Results   Component Value Date   CHOL 208 (H) 01/13/2020   HDL 56 01/13/2020   LDLCALC 138 (H) 01/13/2020   TRIG 80 01/13/2020   CHOLHDL 3.7 01/13/2020   Lab Results  Component Value Date   ALT 23 01/13/2020   AST 24 01/13/2020   ALKPHOS 73 01/13/2020   BILITOT 0.5 01/13/2020   Cancer screening Colonoscopy 04/04/2020, Dr. Hilarie Fredrickson, multiple polyps.  Hyperplastic polyps and tubular adenomas.  Repeat colonoscopy in 3 years. Lab Results  Component Value Date   PSA1 1.6 01/13/2020   PSA1 1.1 01/29/2017   PSA 0.99 04/12/2014  The natural history of prostate cancer and ongoing controversy regarding screening and potential treatment outcomes of prostate cancer has been discussed with the patient. The meaning of a false positive PSA and a false negative PSA has been discussed. He indicates understanding of the limitations of this screening test and wishes to proceed with screening PSA testing. Brother [name] has been treated for cancer.   Immunization History  Administered Date(s) Administered   Influenza,inj,Quad PF,6+ Mos 09/20/2018   Influenza-Unspecified 08/19/2013, 11/15/2014, 09/08/2016   Tdap 01/29/2017   Zoster 09/08/2016  declines flu vaccine at this time.   Depression screen [facility] 2/9 01/12/2021 01/13/2020 12/14/2019 12/11/2019 09/09/2019  Decreased Interest 0 0 0 0 0  Down, Depressed, Hopeless 0 0 0 0 0  PHQ - 2 Score 0 0 0 0 0    No exam data  present  optho - every other year. Wears glasses only.   Dental: every 6 months.   Exercise: Plans on increasing exercise. Walking did well in past.  Youngest daughter's wedding in 2 weeks. Plans on increasing.   Alcohol, tobacco: No tobacco.  Alcohol - about 14 per week.     Patient Active Problem List   Diagnosis Date Noted   History of COVID-19 06/13/2020   Past Medical History:  Diagnosis Date   Allergy    GERD (gastroesophageal reflux disease)    Past Surgical History:  Procedure Laterality Date   CHOLECYSTECTOMY      COLONOSCOPY  2015   polyps5 TAs   VASECTOMY     WISDOM TOOTH EXTRACTION     Allergies  Allergen Reactions   Dust Mite Extract Anaphylaxis    allergy   Prior to Admission medications   Medication Sig Start Date End Date Taking? Authorizing Provider  acetaminophen (TYLENOL) 325 MG tablet Take 650 mg by mouth every 6 (six) hours as needed for mild pain or headache.    [provider]  Ascorbic Acid (VITAMIN C PO) Take 1 tablet by mouth daily.    [provider]  Multiple Vitamins-Minerals (ZINC PO) Take 1 tablet by mouth daily.    [provider]  VITAMIN D PO Take 1 tablet by mouth daily.    [provider]   Social History   Socioeconomic History   Marital status: Married    Spouse name: Not on file   Number of children: Not on file   Years of education: Not on file   Highest education level: Not on file  Occupational History   Not on file  Tobacco Use   Smoking status: Never Smoker   Smokeless tobacco: Never Used  Vaping Use   Vaping Use: Never used  Substance and Sexual Activity   Alcohol use: Yes    Alcohol/week: 14.0 standard drinks    Types: 14 Glasses of wine per week    Comment: 14 drinks beer/wine per week per patient   Drug use: No   Sexual activity: Yes  Other Topics Concern   Not on file  Social History Narrative   Married. Education: [employer].   Social Determinants of Health   Financial Resource Strain: Not on file  Food Insecurity: Not on file  Transportation Needs: Not on file  Physical Activity: Not on file  Stress: Not on file  Social Connections: Not on file  Intimate Partner Violence: Not on file    Observations/Objective: Vitals:   01/12/21 0816  Weight: 207 lb (93.9 kg)  Height: 5\' 5"  (1.651 m)   Nontoxic appearance on video, no distress, normal respiratory effort.  All questions were answered, understanding of plan expressed.  Assessment and Plan: Annual physical exam  -  -anticipatory guidance as below in AVS, screening labs above. Health maintenance items as above in HPI discussed/recommended as applicable.   Abnormality of lung on CXR - Plan: CT Chest Wo Contrast  -Possible scar versus pleural-based abnormality, will check CT.   Hyperlipidemia, unspecified hyperlipidemia type - Plan: Comprehensive metabolic panel, Lipid panel  -Check baseline labs, plans on increased walking/weight loss.  Option of repeat testing in 6 months if elevated.  Screening for prostate cancer - Plan: PSA Family history of prostate cancer - Plan: PSA  -Family history of prostate cancer in his brother, will check PSA, option of DRE if elevated or increased velocity.  Option of urology eval.  Follow Up Instructions: 1  year for physical  Patient Instructions  Good talking to you today.  I will order some labs as we discussed.  CT scan was also ordered to check out the upper right lung from x-ray in August.  Multivitamin once a day is probably reasonable but make sure to eat a well-rounded diet with fruits and vegetables.  Walking for exercise most days per week is great, with a minimum exercise of 150 minutes/week.  I think that will help with weight as well as cholesterol.  Depending on cholesterol readings, can recheck levels in the next 6 months to see if medications would be recommended.  Let me know if there are questions.  Here are a few lab drawing stations for you to have your labwork performed:  [address], [place], [place] [address]  [place] [place], [place] [address]  Keeping you healthy  Get these tests  Blood pressure- Have your blood pressure checked once a year by your healthcare provider.  Normal blood pressure is 120/80  Weight- Have your body mass index (BMI) calculated to screen for obesity.  BMI is a measure of body fat based on height and weight. You can also calculate your own BMI at  [email or web address].  Cholesterol- Have your cholesterol checked every year.  Diabetes- Have your blood sugar checked regularly if you have high blood pressure, high cholesterol, have a family history of diabetes or if you are overweight.  Screening for Colon Cancer- Colonoscopy starting at age 15.  Screening may begin sooner depending on your family history and other health conditions. Follow up colonoscopy as directed by your Gastroenterologist.  Screening for Prostate Cancer- Both blood work (PSA) and a rectal exam help screen for Prostate Cancer.  Screening begins at age 10 with African-American men and at age 64 with Caucasian men.  Screening may begin sooner depending on your family history.   Take these medicines  Flu shot- Every fall.  Tetanus- Every 10 years.   Pneumonia shot- Once after the age of 21; if you are younger than 22, ask your healthcare provider if you need a Pneumonia shot.  Take these steps  Don't smoke- If you do smoke, talk to your doctor about quitting.  For tips on how to quit, go to www.smokefree.gov or call 1-800-QUIT-NOW.  Be physically active- Exercise 5 days a week for at least 30 minutes.  If you are not already physically active start slow and gradually work up to 30 minutes of moderate physical activity.  Examples of moderate activity include walking briskly, mowing the yard, dancing, swimming, bicycling, etc.  Eat a healthy diet- Eat a variety of healthy food such as fruits, vegetables, low fat milk, low fat cheese, yogurt, lean meant, poultry, fish, beans, tofu, etc. For more information go to www.thenutritionsource.org  Drink alcohol in moderation- Limit alcohol intake to less than two drinks a day. Never drink and drive.  Dentist- Brush and floss twice daily; visit your dentist twice a year.  Depression- Your emotional health is as important as your physical health. If you're feeling down, or losing interest in things you would normally  enjoy please talk to your healthcare provider.  Eye exam- Visit your eye doctor every year.  Safe sex- If you may be exposed to a sexually transmitted infection, use a condom.  Seat belts- Seat belts can save your life; always wear one.  Smoke/Carbon Monoxide detectors- These detectors need to be installed on the appropriate level of your home.  Replace batteries at least once a year.  Skin cancer- When out in the sun, cover up and use sunscreen 15 SPF or higher.  Violence- If anyone is threatening you, please tell your healthcare provider.  Living Will/ Health care power of attorney- Speak with your healthcare provider and family.     I discussed the assessment and treatment plan with the patient. The patient was provided an opportunity to ask questions and all were answered. The patient agreed with the plan and demonstrated an understanding of the instructions.   The patient was advised to call back or seek an in-person evaluation if the symptoms worsen or if the condition fails to improve as anticipated.  I provided 29 minutes of non-face-to-face time during this encounter.   Wendie Agreste, MD

## 2021-01-12 NOTE — Patient Instructions (Addendum)
Good talking to you today.  I will order some labs as we discussed.  CT scan was also ordered to check out the upper right lung from x-ray in August.  Multivitamin once a day is probably reasonable but make sure to eat a well-rounded diet with fruits and vegetables.  Walking for exercise most days per week is great, with a minimum exercise of 150 minutes/week.  I think that will help with weight as well as cholesterol.  Depending on cholesterol readings, can recheck levels in the next 6 months to see if medications would be recommended.  Let me know if there are questions.  Here are a few lab drawing stations for you to have your labwork performed:  [address], [place], [place] [address]  [place], [place] [address]  Keeping you healthy  Get these tests  Blood pressure- Have your blood pressure checked once a year by your healthcare provider.  Normal blood pressure is 120/80  Weight- Have your body mass index (BMI) calculated to screen for obesity.  BMI is a measure of body fat based on height and weight. You can also calculate your own BMI at [email or web address].  Cholesterol- Have your cholesterol checked every year.  Diabetes- Have your blood sugar checked regularly if you have high blood pressure, high cholesterol, have a family history of diabetes or if you are overweight.  Screening for Colon Cancer- Colonoscopy starting at age 36.  Screening may begin sooner depending on your family history and other health conditions. Follow up colonoscopy as directed by your Gastroenterologist.  Screening for Prostate Cancer- Both blood work (PSA) and a rectal exam help screen for Prostate Cancer.  Screening begins at age 84 with African-American men and at age 50 with Caucasian men.  Screening may begin sooner depending on your family history.   Take these medicines  Flu shot- Every fall.  Tetanus- Every 10 years.   Pneumonia shot- Once after  the age of 68; if you are younger than 27, ask your healthcare provider if you need a Pneumonia shot.  Take these steps  Don't smoke- If you do smoke, talk to your doctor about quitting.  For tips on how to quit, go to www.smokefree.gov or call 1-800-QUIT-NOW.  Be physically active- Exercise 5 days a week for at least 30 minutes.  If you are not already physically active start slow and gradually work up to 30 minutes of moderate physical activity.  Examples of moderate activity include walking briskly, mowing the yard, dancing, swimming, bicycling, etc.  Eat a healthy diet- Eat a variety of healthy food such as fruits, vegetables, low fat milk, low fat cheese, yogurt, lean meant, poultry, fish, beans, tofu, etc. For more information go to www.thenutritionsource.org  Drink alcohol in moderation- Limit alcohol intake to less than two drinks a day. Never drink and drive.  Dentist- Brush and floss twice daily; visit your dentist twice a year.  Depression- Your emotional health is as important as your physical health. If you're feeling down, or losing interest in things you would normally enjoy please talk to your healthcare provider.  Eye exam- Visit your eye doctor every year.  Safe sex- If you may be exposed to a sexually transmitted infection, use a condom.  Seat belts- Seat belts can save your life; always wear one.  Smoke/Carbon Monoxide detectors- These detectors need to be installed on the appropriate level of your home.  Replace batteries at least once a year.  Skin cancer- When out in the sun, cover up and use sunscreen 15 SPF or higher.  Violence- If anyone is threatening you, please tell your healthcare provider.  Living Will/ Health care power of attorney- Speak with your healthcare provider and family.

## 2021-01-31 ENCOUNTER — Ambulatory Visit
Admission: RE | Admit: 2021-01-31 | Discharge: 2021-01-31 | Disposition: A | Discharge status: Home / Self Care | Payer: BC Managed Care – PPO | Source: Ambulatory Visit | Attending: Family Medicine | Admitting: Family Medicine

## 2021-01-31 DIAGNOSIS — R918 Other nonspecific abnormal finding of lung field: Secondary | ICD-10-CM

## 2021-01-31 DIAGNOSIS — J9811 Atelectasis: Secondary | ICD-10-CM | POA: Diagnosis not present

## 2021-01-31 DIAGNOSIS — E785 Hyperlipidemia, unspecified: Secondary | ICD-10-CM | POA: Diagnosis not present

## 2021-01-31 DIAGNOSIS — J984 Other disorders of lung: Secondary | ICD-10-CM | POA: Diagnosis not present

## 2021-01-31 DIAGNOSIS — Z125 Encounter for screening for malignant neoplasm of prostate: Secondary | ICD-10-CM | POA: Diagnosis not present

## 2021-01-31 DIAGNOSIS — J929 Pleural plaque without asbestos: Secondary | ICD-10-CM | POA: Diagnosis not present

## 2021-01-31 DIAGNOSIS — I2510 Atherosclerotic heart disease of native coronary artery without angina pectoris: Secondary | ICD-10-CM | POA: Diagnosis not present

## 2021-01-31 DIAGNOSIS — Z8042 Family history of malignant neoplasm of prostate: Secondary | ICD-10-CM | POA: Diagnosis not present

## 2021-01-31 IMAGING — CT CT CHEST W/O CM
2 of 4 series · 12 of 36 positions shown, 15 images · non-contrast
Comparison: Chest radiograph [DATE]

CLINICAL DATA: Questionable lesion right apex on prior chest
radiograph

EXAM:
CT CHEST WITHOUT CONTRAST
TECHNIQUE: Multidetector CT imaging of the chest was performed following the
standard protocol without IV contrast.

[Series 2: chest 2.00 br40 s3 · axial · 0.62mm/px · z∈[+1376,+1664]mm · 9 of 168 slices shown, 12 images (1 of 2)]
[im 12/168  mediastinal]
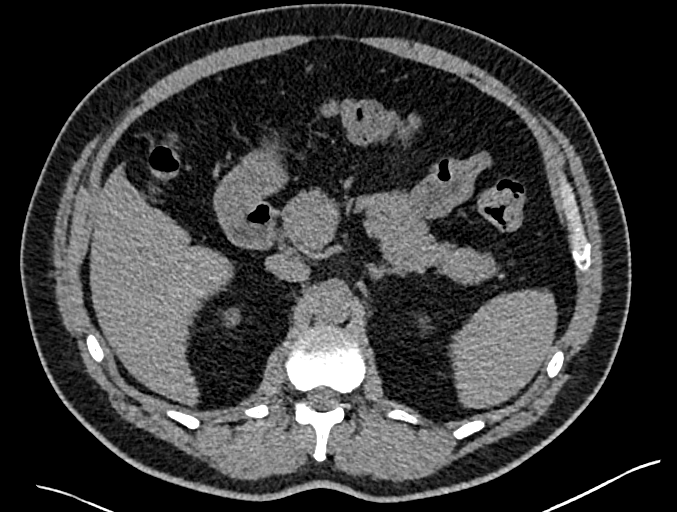
[im 12/168  lung]
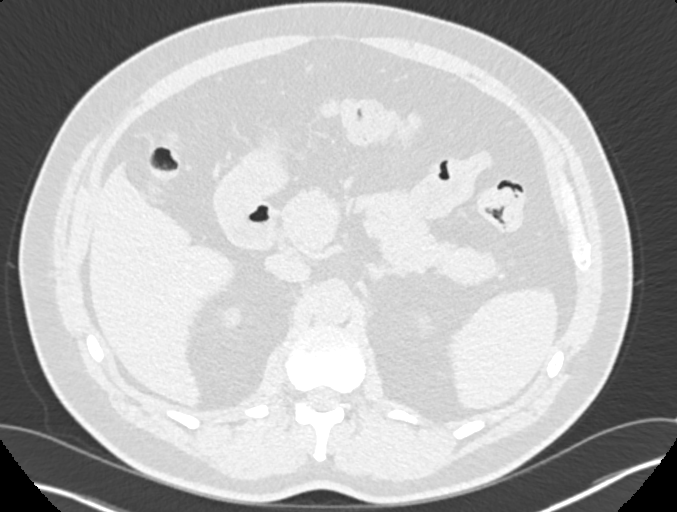
[im 36/168  lung]
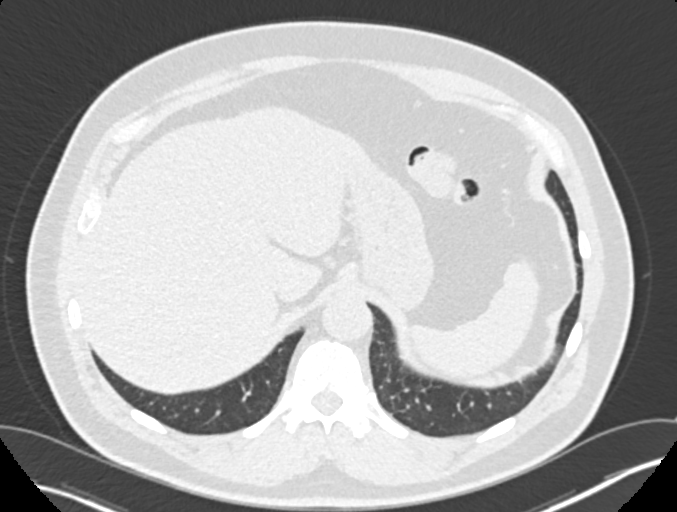
[im 48/168  lung]
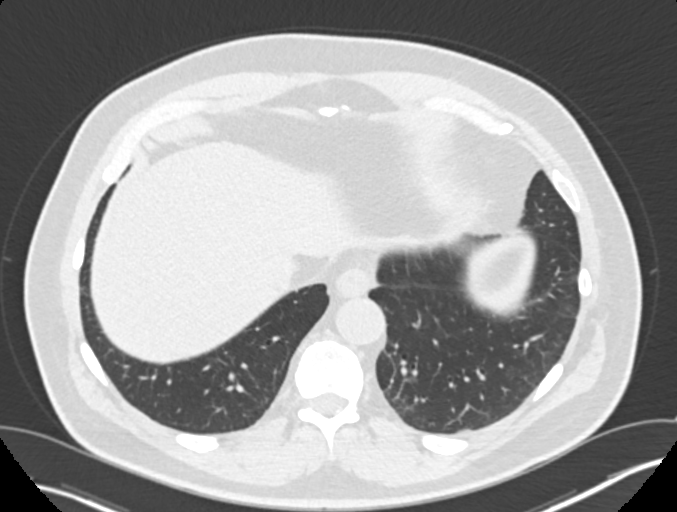
[im 72/168  lung]
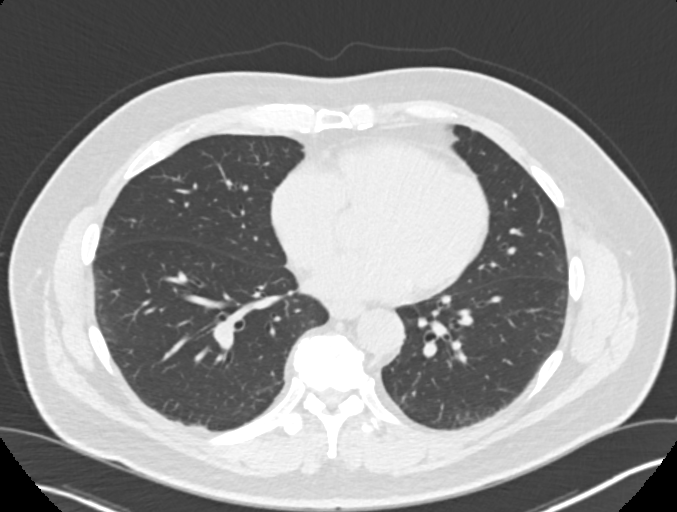
[im 84/168  mediastinal]
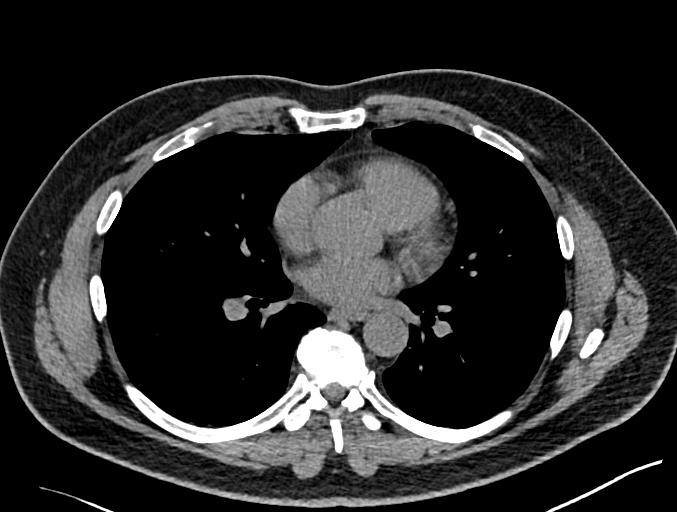
[im 84/168  lung]
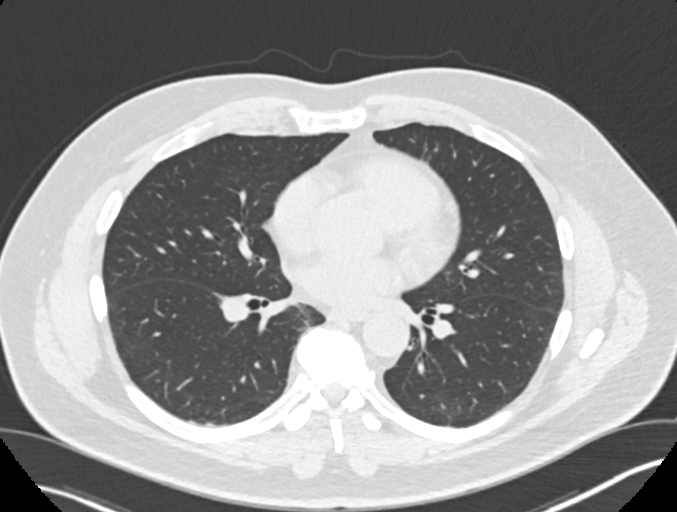
[im 96/168  lung]
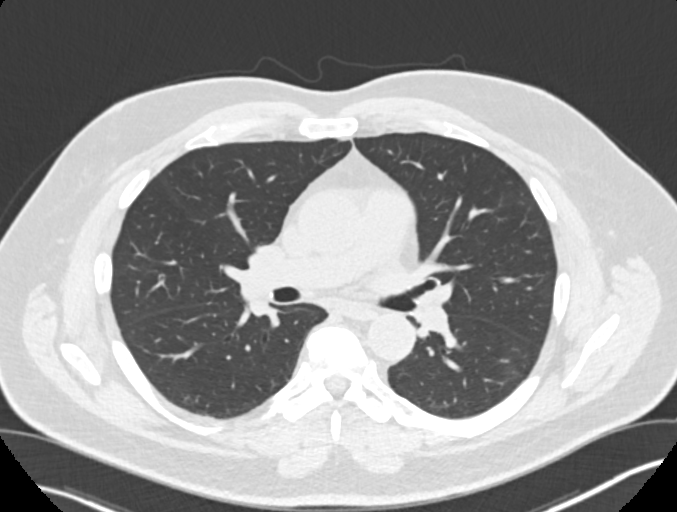
[im 120/168  lung]
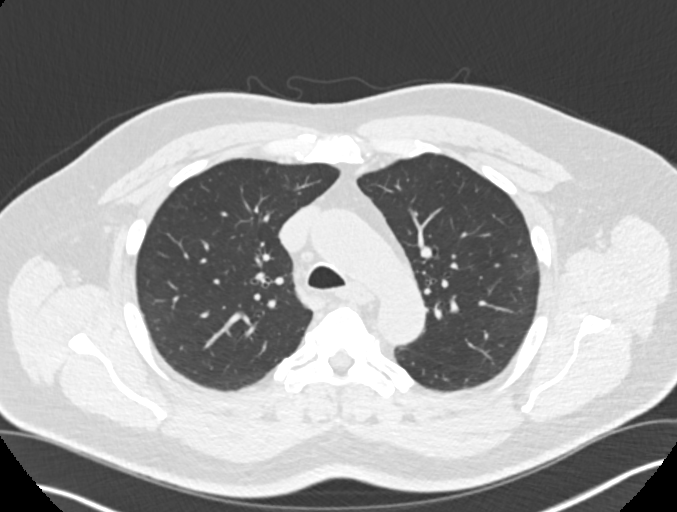
[im 132/168  lung]
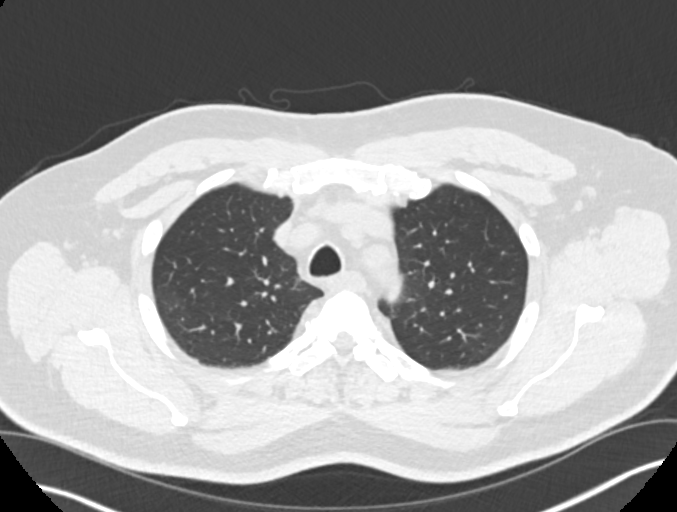
[im 156/168  mediastinal]
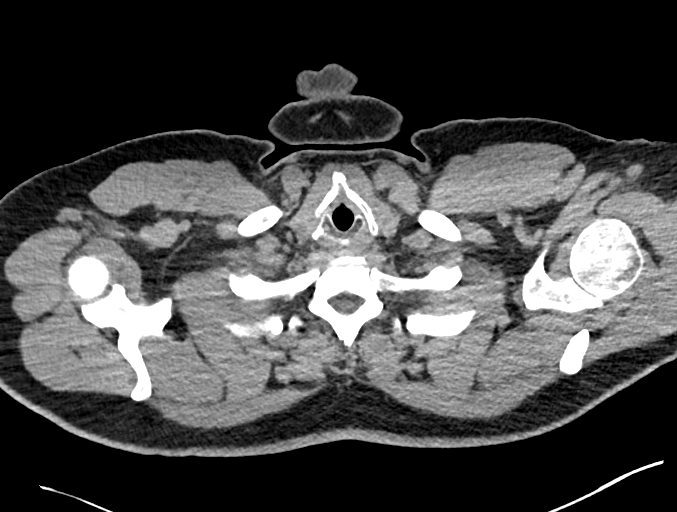
[im 156/168  lung]
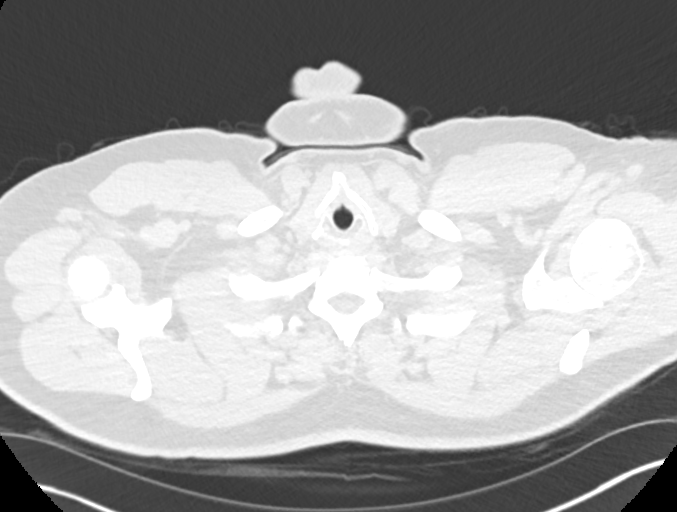

[Series 4: chest 2.00 br40 s3 · coronal · 0.66mm/px · 3 of 159 slices shown (2 of 2)]
[im 32/159  lung]
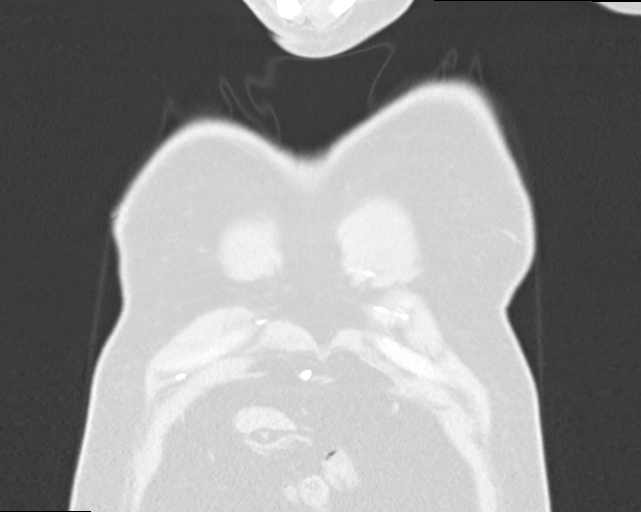
[im 64/159  lung]
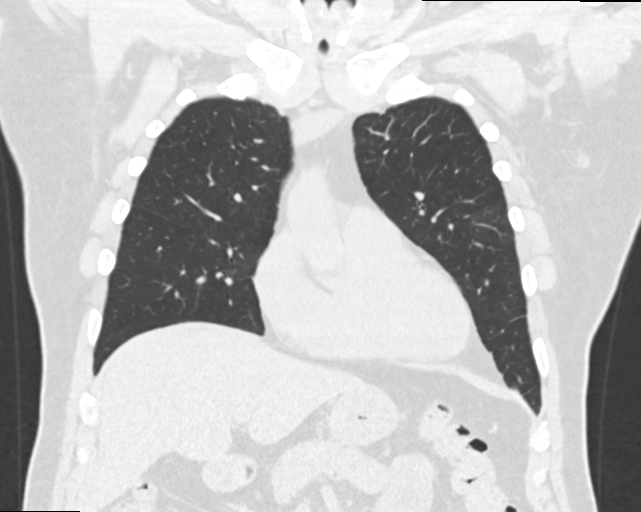
[im 95/159  lung]
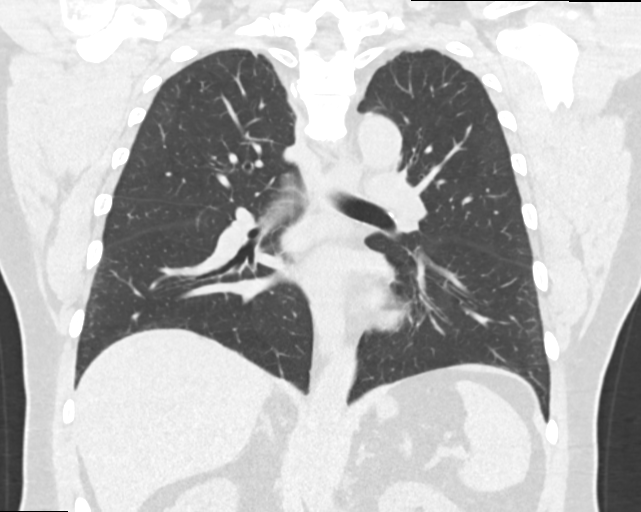

[12 of 36 positions shown; findings below may reference images not displayed]

FINDINGS: Cardiovascular: There is no demonstrable thoracic aortic aneurysm.
Visualized great vessels appear unremarkable on noncontrast enhanced
study. There is no appreciable pericardial effusion or pericardial
thickening. A slight amount of coronary artery calcification is
noted.

Mediastinum/Nodes: Thyroid appears normal. No evident thoracic
adenopathy. No esophageal lesions evident.

Lungs/Pleura: There is slight scarring in each lung apex. There are
occasional foci of mild posterior pleural thickening. There is
slight bibasilar atelectasis. No edema or airspace opacity. No
pleural effusions. Note that there is no parenchymal nodular lesion
evident in the right apex region. No pneumothorax. Trachea and major
bronchial structures appear patent.

Upper Abdomen: Gallbladder absent. Visualized upper abdominal
structures otherwise appear normal.

Musculoskeletal: There are foci of degenerative change in the
thoracic spine. There are no blastic or lytic bone lesions. No chest
wall lesions evident.
IMPRESSION: 1. Slight scarring in each apex. No parenchymal nodular lesion in
the right apex region. Areas of mild posterior pleural thickening on
the right. No edema or airspace opacity. No pleural effusions.

2.  No evident adenopathy.

3.  Slight amount of coronary artery calcification noted.

4.  Gallbladder absent.

## 2021-02-01 LAB — COMPREHENSIVE METABOLIC PANEL
ALT: 28 IU/L (ref 0–44)
AST: 29 IU/L (ref 0–40)
Albumin/Globulin Ratio: 1.7 (ref 1.2–2.2)
Albumin: 4.5 g/dL (ref 3.8–4.9)
Alkaline Phosphatase: 76 IU/L (ref 44–121)
BUN/Creatinine Ratio: 13 (ref 10–24)
BUN: 13 mg/dL (ref 8–27)
Bilirubin Total: 0.5 mg/dL (ref 0.0–1.2)
CO2: 21 mmol/L (ref 20–29)
Calcium: 9.4 mg/dL (ref 8.6–10.2)
Chloride: 103 mmol/L (ref 96–106)
Creatinine, Ser: 1 mg/dL (ref 0.76–1.27)
Globulin, Total: 2.7 g/dL (ref 1.5–4.5)
Glucose: 87 mg/dL (ref 65–99)
Potassium: 4.8 mmol/L (ref 3.5–5.2)
Sodium: 141 mmol/L (ref 134–144)
Total Protein: 7.2 g/dL (ref 6.0–8.5)
eGFR: 86 mL/min/{1.73_m2} (ref >59)

## 2021-02-01 LAB — LIPID PANEL
Chol/HDL Ratio: 3.4 ratio (ref 0.0–5.0)
Cholesterol, Total: 197 mg/dL (ref 100–199)
HDL: 58 mg/dL
LDL Chol Calc (NIH): 121 mg/dL — ABNORMAL HIGH (ref 0–99)
Triglycerides: 101 mg/dL (ref 0–149)
VLDL Cholesterol Cal: 18 mg/dL (ref 5–40)

## 2021-02-01 LAB — PSA: Prostate Specific Ag, Serum: 1.5 ng/mL (ref 0.0–4.0)

## 2021-02-04 ENCOUNTER — Encounter: Payer: Self-pay | Admitting: Family Medicine

## 2021-05-29 ENCOUNTER — Encounter: Payer: Self-pay | Admitting: Family Medicine

## 2021-05-29 ENCOUNTER — Telehealth: Payer: Self-pay

## 2021-05-29 DIAGNOSIS — Z1159 Encounter for screening for other viral diseases: Secondary | ICD-10-CM | POA: Diagnosis not present

## 2021-05-29 NOTE — Telephone Encounter (Signed)
Reprinted letter for PCR testing as original did not include DOB of pt, no further notes required.

## 2021-05-29 NOTE — Telephone Encounter (Signed)
Called pt - he is at [facility]? Needs PCR test order from me to have performed at that location. See letter for order.  [id number] Has not had covid vaccination.  Covid infection in August 2021.

## 2021-09-13 DIAGNOSIS — L538 Other specified erythematous conditions: Secondary | ICD-10-CM | POA: Diagnosis not present

## 2021-09-13 DIAGNOSIS — L814 Other melanin hyperpigmentation: Secondary | ICD-10-CM | POA: Diagnosis not present

## 2021-09-13 DIAGNOSIS — L298 Other pruritus: Secondary | ICD-10-CM | POA: Diagnosis not present

## 2021-09-13 DIAGNOSIS — L570 Actinic keratosis: Secondary | ICD-10-CM | POA: Diagnosis not present

## 2021-09-13 DIAGNOSIS — L821 Other seborrheic keratosis: Secondary | ICD-10-CM | POA: Diagnosis not present

## 2021-09-13 DIAGNOSIS — L820 Inflamed seborrheic keratosis: Secondary | ICD-10-CM | POA: Diagnosis not present

## 2021-09-13 DIAGNOSIS — D225 Melanocytic nevi of trunk: Secondary | ICD-10-CM | POA: Diagnosis not present

## 2022-03-13 DIAGNOSIS — L578 Other skin changes due to chronic exposure to nonionizing radiation: Secondary | ICD-10-CM | POA: Diagnosis not present

## 2022-03-13 DIAGNOSIS — L570 Actinic keratosis: Secondary | ICD-10-CM | POA: Diagnosis not present

## 2022-03-13 DIAGNOSIS — L821 Other seborrheic keratosis: Secondary | ICD-10-CM | POA: Diagnosis not present

## 2022-03-13 DIAGNOSIS — D492 Neoplasm of unspecified behavior of bone, soft tissue, and skin: Secondary | ICD-10-CM | POA: Diagnosis not present

## 2022-03-13 DIAGNOSIS — L814 Other melanin hyperpigmentation: Secondary | ICD-10-CM | POA: Diagnosis not present

## 2022-03-13 DIAGNOSIS — D225 Melanocytic nevi of trunk: Secondary | ICD-10-CM | POA: Diagnosis not present

## 2022-03-21 ENCOUNTER — Telehealth: Payer: Self-pay

## 2022-03-21 NOTE — Telephone Encounter (Signed)
Sent dermatopathology report to scan.

## 2022-04-10 DIAGNOSIS — Z713 Dietary counseling and surveillance: Secondary | ICD-10-CM | POA: Diagnosis not present

## 2022-04-26 DIAGNOSIS — Z713 Dietary counseling and surveillance: Secondary | ICD-10-CM | POA: Diagnosis not present

## 2022-05-08 DIAGNOSIS — Z713 Dietary counseling and surveillance: Secondary | ICD-10-CM | POA: Diagnosis not present

## 2022-06-05 DIAGNOSIS — Z713 Dietary counseling and surveillance: Secondary | ICD-10-CM | POA: Diagnosis not present

## 2022-07-05 DIAGNOSIS — Z713 Dietary counseling and surveillance: Secondary | ICD-10-CM | POA: Diagnosis not present

## 2022-07-31 DIAGNOSIS — Z713 Dietary counseling and surveillance: Secondary | ICD-10-CM | POA: Diagnosis not present

## 2022-08-30 DIAGNOSIS — Z713 Dietary counseling and surveillance: Secondary | ICD-10-CM | POA: Diagnosis not present

## 2022-09-13 DIAGNOSIS — L814 Other melanin hyperpigmentation: Secondary | ICD-10-CM | POA: Diagnosis not present

## 2022-09-13 DIAGNOSIS — L578 Other skin changes due to chronic exposure to nonionizing radiation: Secondary | ICD-10-CM | POA: Diagnosis not present

## 2022-09-13 DIAGNOSIS — L821 Other seborrheic keratosis: Secondary | ICD-10-CM | POA: Diagnosis not present

## 2022-09-13 DIAGNOSIS — D225 Melanocytic nevi of trunk: Secondary | ICD-10-CM | POA: Diagnosis not present

## 2022-09-13 DIAGNOSIS — L570 Actinic keratosis: Secondary | ICD-10-CM | POA: Diagnosis not present

## 2022-10-02 DIAGNOSIS — Z713 Dietary counseling and surveillance: Secondary | ICD-10-CM | POA: Diagnosis not present

## 2022-11-05 ENCOUNTER — Other Ambulatory Visit: Payer: Self-pay | Admitting: Internal Medicine

## 2022-11-05 DIAGNOSIS — E7800 Pure hypercholesterolemia, unspecified: Secondary | ICD-10-CM

## 2022-11-05 DIAGNOSIS — Z0000 Encounter for general adult medical examination without abnormal findings: Secondary | ICD-10-CM | POA: Diagnosis not present

## 2022-11-07 DIAGNOSIS — R7989 Other specified abnormal findings of blood chemistry: Secondary | ICD-10-CM | POA: Diagnosis not present

## 2022-11-09 ENCOUNTER — Other Ambulatory Visit: Payer: Self-pay | Admitting: Internal Medicine

## 2022-11-09 DIAGNOSIS — R519 Headache, unspecified: Secondary | ICD-10-CM

## 2022-11-13 DIAGNOSIS — Z713 Dietary counseling and surveillance: Secondary | ICD-10-CM | POA: Diagnosis not present

## 2022-11-20 ENCOUNTER — Other Ambulatory Visit: Payer: Self-pay | Admitting: Internal Medicine

## 2022-11-20 DIAGNOSIS — G4484 Primary exertional headache: Secondary | ICD-10-CM

## 2022-11-26 DIAGNOSIS — L570 Actinic keratosis: Secondary | ICD-10-CM | POA: Diagnosis not present

## 2022-12-11 ENCOUNTER — Encounter: Payer: Self-pay | Admitting: Internal Medicine

## 2022-12-12 ENCOUNTER — Other Ambulatory Visit: Payer: Self-pay | Admitting: Internal Medicine

## 2022-12-12 ENCOUNTER — Other Ambulatory Visit: Payer: BC Managed Care – PPO

## 2022-12-12 DIAGNOSIS — R519 Headache, unspecified: Secondary | ICD-10-CM

## 2022-12-12 DIAGNOSIS — E7800 Pure hypercholesterolemia, unspecified: Secondary | ICD-10-CM

## 2022-12-13 ENCOUNTER — Ambulatory Visit
Admission: RE | Admit: 2022-12-13 | Discharge: 2022-12-13 | Disposition: A | Discharge status: Home / Self Care | Payer: BC Managed Care – PPO | Source: Ambulatory Visit | Attending: Internal Medicine | Admitting: Internal Medicine

## 2022-12-13 DIAGNOSIS — R519 Headache, unspecified: Secondary | ICD-10-CM

## 2022-12-13 DIAGNOSIS — J341 Cyst and mucocele of nose and nasal sinus: Secondary | ICD-10-CM | POA: Diagnosis not present

## 2022-12-13 DIAGNOSIS — J320 Chronic maxillary sinusitis: Secondary | ICD-10-CM | POA: Diagnosis not present

## 2022-12-14 ENCOUNTER — Encounter: Payer: Self-pay | Admitting: Internal Medicine

## 2022-12-14 ENCOUNTER — Other Ambulatory Visit: Payer: Self-pay | Admitting: Internal Medicine

## 2022-12-14 DIAGNOSIS — D333 Benign neoplasm of cranial nerves: Secondary | ICD-10-CM

## 2022-12-31 DIAGNOSIS — E7800 Pure hypercholesterolemia, unspecified: Secondary | ICD-10-CM | POA: Diagnosis not present

## 2022-12-31 DIAGNOSIS — R519 Headache, unspecified: Secondary | ICD-10-CM | POA: Diagnosis not present

## 2022-12-31 DIAGNOSIS — I2510 Atherosclerotic heart disease of native coronary artery without angina pectoris: Secondary | ICD-10-CM | POA: Diagnosis not present

## 2022-12-31 DIAGNOSIS — D333 Benign neoplasm of cranial nerves: Secondary | ICD-10-CM | POA: Diagnosis not present

## 2023-01-15 ENCOUNTER — Ambulatory Visit
Admission: RE | Admit: 2023-01-15 | Discharge: 2023-01-15 | Disposition: A | Discharge status: Home / Self Care | Payer: BC Managed Care – PPO | Source: Ambulatory Visit | Attending: Internal Medicine | Admitting: Internal Medicine

## 2023-01-15 DIAGNOSIS — D333 Benign neoplasm of cranial nerves: Secondary | ICD-10-CM

## 2023-01-15 DIAGNOSIS — R9082 White matter disease, unspecified: Secondary | ICD-10-CM | POA: Diagnosis not present

## 2023-01-15 MED ORDER — GADOPICLENOL 0.5 MMOL/ML IV SOLN
9.0000 mL | Freq: Once | INTRAVENOUS | Status: AC | PRN
  Administered 2023-01-15: 9 mL via INTRAVENOUS

## 2023-01-23 ENCOUNTER — Other Ambulatory Visit (HOSPITAL_COMMUNITY): Payer: Self-pay | Admitting: Internal Medicine

## 2023-01-23 DIAGNOSIS — E7800 Pure hypercholesterolemia, unspecified: Secondary | ICD-10-CM

## 2023-02-14 ENCOUNTER — Ambulatory Visit (HOSPITAL_COMMUNITY)
Admission: RE | Admit: 2023-02-14 | Discharge: 2023-02-14 | Disposition: A | Discharge status: Home / Self Care | Payer: BC Managed Care – PPO | Source: Ambulatory Visit | Attending: Internal Medicine | Admitting: Internal Medicine

## 2023-02-14 DIAGNOSIS — E7800 Pure hypercholesterolemia, unspecified: Secondary | ICD-10-CM | POA: Insufficient documentation

## 2023-03-14 DIAGNOSIS — L814 Other melanin hyperpigmentation: Secondary | ICD-10-CM | POA: Diagnosis not present

## 2023-03-14 DIAGNOSIS — L738 Other specified follicular disorders: Secondary | ICD-10-CM | POA: Diagnosis not present

## 2023-03-14 DIAGNOSIS — L570 Actinic keratosis: Secondary | ICD-10-CM | POA: Diagnosis not present

## 2023-04-02 DIAGNOSIS — Q165 Congenital malformation of inner ear: Secondary | ICD-10-CM | POA: Diagnosis not present

## 2023-04-02 DIAGNOSIS — R9389 Abnormal findings on diagnostic imaging of other specified body structures: Secondary | ICD-10-CM | POA: Diagnosis not present

## 2023-04-04 ENCOUNTER — Other Ambulatory Visit: Payer: Self-pay | Admitting: Otolaryngology

## 2023-04-04 DIAGNOSIS — Q165 Congenital malformation of inner ear: Secondary | ICD-10-CM

## 2023-04-29 DIAGNOSIS — H903 Sensorineural hearing loss, bilateral: Secondary | ICD-10-CM | POA: Diagnosis not present

## 2023-08-01 ENCOUNTER — Encounter: Payer: Self-pay | Admitting: Internal Medicine

## 2023-09-10 ENCOUNTER — Other Ambulatory Visit: Payer: Self-pay | Admitting: Otolaryngology

## 2023-09-10 DIAGNOSIS — Q165 Congenital malformation of inner ear: Secondary | ICD-10-CM

## 2023-09-17 DIAGNOSIS — D225 Melanocytic nevi of trunk: Secondary | ICD-10-CM | POA: Diagnosis not present

## 2023-09-17 DIAGNOSIS — L814 Other melanin hyperpigmentation: Secondary | ICD-10-CM | POA: Diagnosis not present

## 2023-09-17 DIAGNOSIS — L821 Other seborrheic keratosis: Secondary | ICD-10-CM | POA: Diagnosis not present

## 2023-09-17 DIAGNOSIS — M720 Palmar fascial fibromatosis [Dupuytren]: Secondary | ICD-10-CM | POA: Diagnosis not present

## 2023-09-17 DIAGNOSIS — L570 Actinic keratosis: Secondary | ICD-10-CM | POA: Diagnosis not present

## 2023-09-30 ENCOUNTER — Ambulatory Visit
Admission: RE | Admit: 2023-09-30 | Discharge: 2023-09-30 | Disposition: A | Discharge status: Home / Self Care | Payer: BC Managed Care – PPO | Source: Ambulatory Visit | Attending: Otolaryngology | Admitting: Otolaryngology

## 2023-09-30 DIAGNOSIS — Q165 Congenital malformation of inner ear: Secondary | ICD-10-CM

## 2023-09-30 DIAGNOSIS — H938X2 Other specified disorders of left ear: Secondary | ICD-10-CM | POA: Diagnosis not present

## 2023-09-30 MED ORDER — GADOPICLENOL 0.5 MMOL/ML IV SOLN
10.0000 mL | Freq: Once | INTRAVENOUS | Status: AC | PRN
  Administered 2023-09-30: 10 mL via INTRAVENOUS

## 2023-11-11 DIAGNOSIS — R519 Headache, unspecified: Secondary | ICD-10-CM | POA: Diagnosis not present

## 2023-11-11 DIAGNOSIS — Z79899 Other long term (current) drug therapy: Secondary | ICD-10-CM | POA: Diagnosis not present

## 2023-11-11 DIAGNOSIS — Z0000 Encounter for general adult medical examination without abnormal findings: Secondary | ICD-10-CM | POA: Diagnosis not present

## 2023-11-11 DIAGNOSIS — E7800 Pure hypercholesterolemia, unspecified: Secondary | ICD-10-CM | POA: Diagnosis not present

## 2023-11-11 DIAGNOSIS — Z125 Encounter for screening for malignant neoplasm of prostate: Secondary | ICD-10-CM | POA: Diagnosis not present

## 2023-11-11 DIAGNOSIS — K219 Gastro-esophageal reflux disease without esophagitis: Secondary | ICD-10-CM | POA: Diagnosis not present

## 2024-01-23 DIAGNOSIS — R972 Elevated prostate specific antigen [PSA]: Secondary | ICD-10-CM | POA: Diagnosis not present

## 2024-02-13 DIAGNOSIS — Z23 Encounter for immunization: Secondary | ICD-10-CM | POA: Diagnosis not present

## 2024-03-18 DIAGNOSIS — L570 Actinic keratosis: Secondary | ICD-10-CM | POA: Diagnosis not present

## 2024-04-13 DIAGNOSIS — M25519 Pain in unspecified shoulder: Secondary | ICD-10-CM | POA: Diagnosis not present

## 2024-04-13 DIAGNOSIS — M545 Low back pain, unspecified: Secondary | ICD-10-CM | POA: Diagnosis not present

## 2024-04-13 DIAGNOSIS — M546 Pain in thoracic spine: Secondary | ICD-10-CM | POA: Diagnosis not present

## 2024-04-13 DIAGNOSIS — M542 Cervicalgia: Secondary | ICD-10-CM | POA: Diagnosis not present

## 2024-04-16 DIAGNOSIS — M542 Cervicalgia: Secondary | ICD-10-CM | POA: Diagnosis not present

## 2024-04-16 DIAGNOSIS — M545 Low back pain, unspecified: Secondary | ICD-10-CM | POA: Diagnosis not present

## 2024-04-16 DIAGNOSIS — M546 Pain in thoracic spine: Secondary | ICD-10-CM | POA: Diagnosis not present

## 2024-04-16 DIAGNOSIS — M25519 Pain in unspecified shoulder: Secondary | ICD-10-CM | POA: Diagnosis not present

## 2024-09-22 DIAGNOSIS — C44321 Squamous cell carcinoma of skin of nose: Secondary | ICD-10-CM | POA: Diagnosis not present

## 2024-09-22 DIAGNOSIS — L814 Other melanin hyperpigmentation: Secondary | ICD-10-CM | POA: Diagnosis not present

## 2024-09-22 DIAGNOSIS — D492 Neoplasm of unspecified behavior of bone, soft tissue, and skin: Secondary | ICD-10-CM | POA: Diagnosis not present

## 2024-09-22 DIAGNOSIS — C44622 Squamous cell carcinoma of skin of right upper limb, including shoulder: Secondary | ICD-10-CM | POA: Diagnosis not present

## 2024-09-22 DIAGNOSIS — L821 Other seborrheic keratosis: Secondary | ICD-10-CM | POA: Diagnosis not present

## 2024-09-22 DIAGNOSIS — L570 Actinic keratosis: Secondary | ICD-10-CM | POA: Diagnosis not present

## 2024-09-22 DIAGNOSIS — D225 Melanocytic nevi of trunk: Secondary | ICD-10-CM | POA: Diagnosis not present
